# Patient Record
Sex: Male | Born: 1980 | Hispanic: No | Marital: Single | Smoking: Current every day smoker
Health system: Southern US, Community
[De-identification: ages and names within clinical notes are randomized; demographics above are authoritative.]

---
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
## 2002-06-19 ENCOUNTER — Emergency Department (HOSPITAL_COMMUNITY): Admission: EM | Admit: 2002-06-19 | Discharge: 2002-06-19 | Payer: Self-pay | Admitting: Emergency Medicine

## 2002-06-28 ENCOUNTER — Emergency Department (HOSPITAL_COMMUNITY): Admission: EM | Admit: 2002-06-28 | Discharge: 2002-06-28 | Payer: Self-pay | Admitting: Emergency Medicine

## 2002-07-05 ENCOUNTER — Emergency Department (HOSPITAL_COMMUNITY): Admission: EM | Admit: 2002-07-05 | Discharge: 2002-07-05 | Payer: Self-pay | Admitting: Emergency Medicine

## 2002-12-16 ENCOUNTER — Emergency Department (HOSPITAL_COMMUNITY): Admission: EM | Admit: 2002-12-16 | Discharge: 2002-12-16 | Payer: Self-pay | Admitting: Emergency Medicine

## 2003-03-21 ENCOUNTER — Emergency Department (HOSPITAL_COMMUNITY): Admission: EM | Admit: 2003-03-21 | Discharge: 2003-03-21 | Payer: Self-pay | Admitting: Emergency Medicine

## 2005-07-29 ENCOUNTER — Emergency Department (HOSPITAL_COMMUNITY): Admission: EM | Admit: 2005-07-29 | Discharge: 2005-07-29 | Payer: Self-pay | Admitting: Emergency Medicine

## 2005-11-18 ENCOUNTER — Emergency Department (HOSPITAL_COMMUNITY): Admission: EM | Admit: 2005-11-18 | Discharge: 2005-11-18 | Payer: Self-pay | Admitting: Emergency Medicine

## 2005-11-30 ENCOUNTER — Emergency Department (HOSPITAL_COMMUNITY): Admission: EM | Admit: 2005-11-30 | Discharge: 2005-11-30 | Payer: Self-pay | Admitting: Emergency Medicine

## 2010-04-19 ENCOUNTER — Observation Stay (HOSPITAL_COMMUNITY): Admission: EM | Admit: 2010-04-19 | Discharge: 2010-04-20 | Payer: Self-pay | Admitting: Emergency Medicine

## 2011-02-21 LAB — CARDIAC PANEL(CRET KIN+CKTOT+MB+TROPI)
Relative Index: 1.4 (ref 0.0–2.5)
Total CK: 114 U/L (ref 7–232)
Total CK: 153 U/L (ref 7–232)
Troponin I: 0.01 ng/mL (ref 0.00–0.06)
Troponin I: 0.01 ng/mL (ref 0.00–0.06)
Troponin I: 0.01 ng/mL (ref 0.00–0.06)

## 2011-02-21 LAB — RAPID URINE DRUG SCREEN, HOSP PERFORMED
Barbiturates: NOT DETECTED
Benzodiazepines: NOT DETECTED
Cocaine: POSITIVE — AB

## 2011-02-21 LAB — POCT CARDIAC MARKERS
CKMB, poc: 1.2 ng/mL (ref 1.0–8.0)
Myoglobin, poc: 42.9 ng/mL (ref 12–200)
Myoglobin, poc: 47.4 ng/mL (ref 12–200)
Troponin i, poc: <0.05 ng/mL (ref 0.00–0.09)

## 2011-02-21 LAB — POCT I-STAT, CHEM 8
Creatinine, Ser: 0.9 mg/dL (ref 0.4–1.5)
Glucose, Bld: 106 mg/dL — ABNORMAL HIGH (ref 70–99)
Hemoglobin: 17.3 g/dL — ABNORMAL HIGH (ref 13.0–17.0)
Sodium: 138 mEq/L (ref 135–145)
TCO2: 28 mmol/L (ref 0–100)

## 2011-02-21 LAB — LIPID PANEL
Cholesterol: 160 mg/dL (ref 0–200)
HDL: 31 mg/dL — ABNORMAL LOW (ref >39)
Total CHOL/HDL Ratio: 5.2 RATIO

## 2011-02-21 LAB — D-DIMER, QUANTITATIVE: D-Dimer, Quant: 0.26 ug/mL-FEU (ref 0.00–0.48)

## 2011-04-22 NOTE — H&P (Signed)
[facility]  Patient:    [name], [name]                         MRN: [id number] Adm. Date:  [id number] Attending:  Esau Grew Dictator:   Eduard Roux, NP                         History and Physical  ALLERGIES:  No known allergies.  REVIEW OF SYSTEMS:  HEAD:  Patient denies headache, dizziness, head trauma or loss of consciousness.  EYES:  Patient denies visual changes or infections. Patient does not wear corrective lenses.  EARS:  Denies hearing loss, infections, earaches or tinnitus.  NOSE:  Denies upper respiratory tract symptoms and a history of epistaxis or loss of smell.  THROAT:  Denies hoarseness, pain or lesions.  DENTAL:  Denies gum infections or toothaches. SKIN:  Denies abnormal bruises, rashes or swelling.  CARDIOVASCULAR:  Denies headaches, previous myocardial infarction, palpitations, hypertension, syncope or edema.  Patient does have a murmur.  PULMONARY:  Patient has a history of bronchitis.  No dyspnea at rest or with exertion noted.  No bronchitis or hemoptysis.  GASTROINTESTINAL:  No abdominal pain, peptic ulcer disease, melena, dysphagia, diarrhea, nausea and vomiting. GENITOURINARY:  Denies dysuria, urgency or frequency.  He was treated for gonorrhea approximately one month ago.  MUSCULOSKELETAL:  Denies back pain, joint pain, weakness or paresthesias.  NERVOUS SYSTEM:  Denies seizures or blackouts.  HEMATOPOIETIC: Denies anemia or having received a blood transfusion.  ENDOCRINE:  Denies fever or cold intolerance, palpitations, changes in skin, hair or bowel habits.  No polyuria, polydipsia or polyphagia.  Patient is an 30 year old Native American single male.  He is involuntarily admitted for depression with suicidal ideation.  Additionally, he has been abusing cocaine.  PHYSICAL EXAMINATION:  VITAL SIGNS:  Temperature 97.8, pulse 79, respirations 20, blood pressure 137/69.  GENERAL APPEARANCE:  He is a  well-nourished [place] male.  He is alert and cooperative, in no acute distress.  SKIN:  No abnormal bruises, rashes or swelling could be appreciated.  HEENT:  Head normocephalic.  Eyes:  Extraocular movements intact.  Pupils were equal, round and reactive to light.  Funduscopic exam benign.  Ears:  Canals were clear, tympanic membranes exhibiting normal landmarks.  Nose:  Nares were clear.  No septal deviation.  Throat:  Buccal mucosa was ______. There were no lesions.  Tongue was in midline without vesiculations.  Dentition: Good repair.  NECK:  Supple with trachea midline.  Thorax:  Bilateral symmetrical expansion.  LUNGS:  Clear to auscultation.  HEART:  Grade 1-1/2 murmur with intermittent systolic click audible at fourth intercostal space with slightly irregular rhythm.  VASCULAR:  Pulses were equal.  No bruits could be auscultated at aortic, renal or carotid.  There was no dependent edema.  ABDOMEN:  Soft without masses.  Bowel sounds x 4 and were normal.  No tenderness with palpation.  No organomegaly could be appreciated.  GLANDULAR:  Negative adenopathy, supraclavicular cervical.  Palpable thyroid without nodularity.  BONES AND JOINTS:  No swelling, tenderness or deformity noted.  MUSCULAR:  No atrophy noted.  Strength was equal.  EXTREMITIES:  Full range of motion.  No cyanosis or clubbing.  NEUROLOGICAL EXAM:  Cranial nerves II-XII were grossly intact.  Cerebellar function was intact.  Patient could perform finger to nose, heel to shin. Gait was normal.  Negative Romberg.  Patient could  walk in tandem on his heels, on his toes.  Deep tendon reflexes were 2.  IMPRESSION:  Systolic click, grade 1-1/2 murmur.  Abnormal labs were urinalysis.  Will repeat urinalysis.  LABORATORY VALUES:  Urinalysis with small amount of leukocyte esterase, wbcs 6-10.  CMET was within normal limits.  Urine drug screen was negative.  CBC increased platelets of 443.  Blood alcohol level  was less than 10. DD:  05/03/00 TD:  05/03/00 Job: [id number]
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
## 2011-08-05 ENCOUNTER — Emergency Department (HOSPITAL_COMMUNITY): Payer: Self-pay

## 2011-08-05 ENCOUNTER — Emergency Department (HOSPITAL_COMMUNITY)
Admission: EM | Admit: 2011-08-05 | Discharge: 2011-08-05 | Disposition: A | Discharge status: Home / Self Care | Payer: Self-pay | Attending: Emergency Medicine | Admitting: Emergency Medicine

## 2011-08-05 DIAGNOSIS — M25519 Pain in unspecified shoulder: Secondary | ICD-10-CM | POA: Insufficient documentation

## 2011-08-05 DIAGNOSIS — IMO0002 Reserved for concepts with insufficient information to code with codable children: Secondary | ICD-10-CM | POA: Insufficient documentation

## 2011-08-05 DIAGNOSIS — M25619 Stiffness of unspecified shoulder, not elsewhere classified: Secondary | ICD-10-CM | POA: Insufficient documentation

## 2011-08-05 DIAGNOSIS — R296 Repeated falls: Secondary | ICD-10-CM | POA: Insufficient documentation

## 2011-08-05 DIAGNOSIS — M25429 Effusion, unspecified elbow: Secondary | ICD-10-CM | POA: Insufficient documentation

## 2011-08-05 DIAGNOSIS — Y9364 Activity, baseball: Secondary | ICD-10-CM | POA: Insufficient documentation

## 2012-01-25 ENCOUNTER — Encounter (HOSPITAL_COMMUNITY): Payer: Self-pay | Admitting: Emergency Medicine

## 2012-01-25 ENCOUNTER — Emergency Department (HOSPITAL_COMMUNITY)
Admission: EM | Admit: 2012-01-25 | Discharge: 2012-01-26 | Disposition: A | Discharge status: Home / Self Care | Payer: Self-pay | Attending: Emergency Medicine | Admitting: Emergency Medicine

## 2012-01-25 DIAGNOSIS — J45909 Unspecified asthma, uncomplicated: Secondary | ICD-10-CM | POA: Insufficient documentation

## 2012-01-25 DIAGNOSIS — F17200 Nicotine dependence, unspecified, uncomplicated: Secondary | ICD-10-CM | POA: Insufficient documentation

## 2012-01-25 DIAGNOSIS — K047 Periapical abscess without sinus: Secondary | ICD-10-CM | POA: Insufficient documentation

## 2012-01-25 MED ORDER — PENICILLIN V POTASSIUM 500 MG PO TABS
500.0000 mg | ORAL_TABLET | Freq: Once | ORAL | Status: AC
  Administered 2012-01-25: 500 mg via ORAL
  Filled 2012-01-25: qty 1

## 2012-01-25 MED ORDER — OXYCODONE-ACETAMINOPHEN 5-325 MG PO TABS
1.0000 | ORAL_TABLET | ORAL | Status: AC
  Administered 2012-01-25: 1 via ORAL
  Filled 2012-01-25: qty 1

## 2012-01-25 MED ORDER — OXYCODONE-ACETAMINOPHEN 5-325 MG PO TABS: 1.0000 | ORAL_TABLET | ORAL | Status: AC | PRN

## 2012-01-25 MED ORDER — IBUPROFEN 800 MG PO TABS: 800.0000 mg | ORAL_TABLET | Freq: Four times a day (QID) | ORAL | Status: AC | PRN

## 2012-01-25 MED ORDER — PENICILLIN V POTASSIUM 500 MG PO TABS: 500.0000 mg | ORAL_TABLET | Freq: Four times a day (QID) | ORAL | Status: AC

## 2012-01-25 NOTE — ED Notes (Signed)
Pt alert, nad, c/o dental pain, onset last pm, resp even unlabored, skin pwd

## 2012-01-25 NOTE — Discharge Instructions (Signed)
Call the dentist within forty-eight hours of being seen in the emergency department to set up an appointment for followup.  Please take all of the antibiotics until they are gone.  Use the pain medication as prescribed as needed for pain.  Do not take additional Tylenol while taking the prescribed Percocet as it contains Tylenol; a tylenol overdose can be dangerous to your health or even life threatening.  Please return to the emergency department immediately if you have any difficulty swallowing or breathing develops high fevers while on antibiotics.You may return to the ER at any time for worsening condition or any new symptoms that concern you.   Dental Abscess A dental abscess usually starts from an infected tooth. Antibiotic medicine and pain pills can be helpful, but dental infections require the attention of a dentist. Rinse around the infected area often with salt water (a pinch of salt in 8 oz of warm water). Do not apply heat to the outside of your face. See your dentist or oral surgeon as soon as possible.  SEEK IMMEDIATE MEDICAL CARE IF:  You have increasing, severe pain that is not relieved by medicine.   You or your child has an oral temperature above 102 F (38.9 C), not controlled by medicine.   Your baby is older than 3 months with a rectal temperature of 102 F (38.9 C) or higher.   Your baby is 75 months old or younger with a rectal temperature of 100.4 F (38 C) or higher.   You develop chills, severe headache, difficulty breathing, or trouble swallowing.   You have swelling in the neck or around the eye.  Document Released: 11/21/2005 Document Revised: 08/03/2011 Document Reviewed: 05/02/2007 [facility] Patient Information 2012 [place], [place].

## 2012-01-25 NOTE — ED Provider Notes (Signed)
History     CSN: [id number]  Arrival date & time 01/25/12  2031   First MD Initiated Contact with Patient 01/25/12 2300      Chief Complaint  Patient presents with  . Dental Pain    (Consider location/radiation/quality/duration/timing/severity/associated sxs/prior treatment) HPI Comments: Patient reports he has long-standing dental problems, but cannot afford a dentist at this time.  States he woke up this morning with increased left lower molar pain with some swelling adjacent.  States that throughout the day the pain got worse and the swelling increased.  Reports mild sore throat.  Denies fevers, difficulty swallowing or breathing.  Patient is a 31 y.o. male presenting with tooth pain. The history is provided by the patient.  Dental PainPrimary symptoms do not include shortness of breath.  Additional symptoms do not include: trouble swallowing.    Past Medical History  Diagnosis Date  . Asthma     History reviewed. No pertinent past surgical history.  No family history on file.  History  Substance Use Topics  . Smoking status: Current Everyday Smoker -- 1.0 packs/day    Types: Cigarettes  . Smokeless tobacco: Not on file  . Alcohol Use: No      Review of Systems  HENT: Negative for trouble swallowing.   Respiratory: Negative for shortness of breath and stridor.   All other systems reviewed and are negative.    Allergies  Review of patient's allergies indicates no known allergies.  Home Medications   Current Outpatient Rx  Name Route Sig Dispense Refill  . ACETAMINOPHEN 500 MG PO TABS Oral Take 500 mg by mouth every 6 (six) hours as needed. For pain relief      BP 161/90  Pulse 80  Temp(Src) 98.4 F (36.9 C) (Oral)  Resp 16  Ht 5\' 4"  (1.626 m)  Wt 147 lb 3.2 oz (66.769 kg)  BMI 25.27 kg/m2  SpO2 98%  Physical Exam  Nursing note and vitals reviewed. Constitutional: He is oriented to person, place, and time. He appears well-developed and  well-nourished.  HENT:  Head: Normocephalic and atraumatic.  Mouth/Throat: Uvula is midline, oropharynx is clear and moist and mucous membranes are normal. Dental abscesses present.    Neck: Trachea normal. Neck supple. No tracheal tenderness present. No tracheal deviation present.  Pulmonary/Chest: Effort normal and breath sounds normal. No stridor. No respiratory distress. He has no wheezes. He has no rales. He exhibits no tenderness.  Lymphadenopathy:    He has cervical adenopathy.  Neurological: He is alert and oriented to person, place, and time.  Psychiatric: He has a normal mood and affect. His behavior is normal. Judgment and thought content normal.    ED Course  Procedures (including critical care time)  Labs Reviewed - No data to display No results found.   1. Dental abscess       MDM  Afebrile patient with left lower dental abscess.  No airway compromise, no pharyngeal edema or erythema, no paratracheal tenderness.  Patient given first dose of antibiotic and pain medication here.  Encouraged close follow up with dentist, return for worsening condition.  Patient verbalizes understanding and agrees with plan.          Dillard Cannon [place], [place] 01/26/12 [phone]

## 2012-01-26 NOTE — ED Provider Notes (Signed)
Medical screening examination/treatment/procedure(s) were performed by non-physician practitioner and as supervising physician I was immediately available for consultation/collaboration.   Idrissa Beville, MD 01/26/12 0355 

## 2012-03-24 ENCOUNTER — Emergency Department (HOSPITAL_COMMUNITY)
Admission: EM | Admit: 2012-03-24 | Discharge: 2012-03-24 | Disposition: A | Discharge status: Home / Self Care | Payer: Self-pay | Attending: Emergency Medicine | Admitting: Emergency Medicine

## 2012-03-24 ENCOUNTER — Emergency Department (HOSPITAL_COMMUNITY): Payer: Self-pay

## 2012-03-24 ENCOUNTER — Encounter (HOSPITAL_COMMUNITY): Payer: Self-pay | Admitting: *Deleted

## 2012-03-24 DIAGNOSIS — S43006A Unspecified dislocation of unspecified shoulder joint, initial encounter: Secondary | ICD-10-CM

## 2012-03-24 DIAGNOSIS — S46909A Unspecified injury of unspecified muscle, fascia and tendon at shoulder and upper arm level, unspecified arm, initial encounter: Secondary | ICD-10-CM | POA: Insufficient documentation

## 2012-03-24 DIAGNOSIS — S43016A Anterior dislocation of unspecified humerus, initial encounter: Secondary | ICD-10-CM | POA: Insufficient documentation

## 2012-03-24 DIAGNOSIS — S4980XA Other specified injuries of shoulder and upper arm, unspecified arm, initial encounter: Secondary | ICD-10-CM | POA: Insufficient documentation

## 2012-03-24 DIAGNOSIS — X58XXXA Exposure to other specified factors, initial encounter: Secondary | ICD-10-CM | POA: Insufficient documentation

## 2012-03-24 DIAGNOSIS — M25519 Pain in unspecified shoulder: Secondary | ICD-10-CM | POA: Insufficient documentation

## 2012-03-24 MED ORDER — ETOMIDATE 2 MG/ML IV SOLN
20.0000 mg | Freq: Once | INTRAVENOUS | Status: AC
  Administered 2012-03-24: 20 mg via INTRAVENOUS

## 2012-03-24 MED ORDER — FLUMAZENIL 0.5 MG/5ML IV SOLN
INTRAVENOUS | Status: AC
  Administered 2012-03-24: 0.4 mg via INTRAVENOUS
  Filled 2012-03-24: qty 5

## 2012-03-24 MED ORDER — NALOXONE HCL 0.4 MG/ML IJ SOLN
INTRAMUSCULAR | Status: AC
  Filled 2012-03-24: qty 1

## 2012-03-24 MED ORDER — OXYCODONE-ACETAMINOPHEN 5-325 MG PO TABS: 2.0000 | ORAL_TABLET | ORAL | Status: AC | PRN

## 2012-03-24 MED ORDER — FLUMAZENIL 0.5 MG/5ML IV SOLN
0.4000 mg | Freq: Once | INTRAVENOUS | Status: AC
Start: 2012-03-24 — End: 2012-03-24
  Administered 2012-03-24: 0.4 mg via INTRAVENOUS

## 2012-03-24 MED ORDER — MIDAZOLAM HCL 2 MG/2ML IJ SOLN
3.0000 mg | Freq: Once | INTRAMUSCULAR | Status: AC
  Administered 2012-03-24: 3 mg via INTRAVENOUS

## 2012-03-24 MED ORDER — METHOCARBAMOL 500 MG PO TABS: 500.0000 mg | ORAL_TABLET | Freq: Two times a day (BID) | ORAL | Status: AC

## 2012-03-24 MED ORDER — ETOMIDATE 2 MG/ML IV SOLN
INTRAVENOUS | Status: AC
  Administered 2012-03-24: 20 mg via INTRAVENOUS
  Filled 2012-03-24: qty 10

## 2012-03-24 MED ORDER — FENTANYL CITRATE 0.05 MG/ML IJ SOLN
50.0000 ug | Freq: Once | INTRAMUSCULAR | Status: AC
  Administered 2012-03-24: 50 ug via INTRAVENOUS

## 2012-03-24 MED ORDER — FENTANYL CITRATE 0.05 MG/ML IJ SOLN
INTRAMUSCULAR | Status: AC
  Administered 2012-03-24: 50 ug via INTRAVENOUS
  Filled 2012-03-24: qty 2

## 2012-03-24 MED ORDER — MIDAZOLAM HCL 2 MG/2ML IJ SOLN
INTRAMUSCULAR | Status: AC
  Administered 2012-03-24: 3 mg via INTRAVENOUS
  Filled 2012-03-24: qty 4

## 2012-03-24 MED ORDER — NALOXONE HCL 1 MG/ML IJ SOLN
INTRAMUSCULAR | Status: AC
  Filled 2012-03-24: qty 2

## 2012-03-24 NOTE — ED Notes (Signed)
Shoulder being reduced by Dr Freida Busman at this time

## 2012-03-24 NOTE — ED Notes (Deleted)
Pt c/o pain/deformity to R shoulder x 1 hour after playing sport. Pt received 250 mg Fentanyl by EMS

## 2012-03-24 NOTE — Discharge Instructions (Signed)
Shoulder Dislocation  Your shoulder is made up of three bones: the collar bone (clavicle); the shoulder blade (scapula), which includes the socket (glenoid cavity); and the upper arm bone (humerus). Your shoulder joint is the place where these bones meet. Strong, fibrous tissues hold these bones together (ligaments). Muscles and strong, fibrous tissues that connect the muscles to these bones (tendons) allow your arm to move through this joint. The range of motion of your shoulder joint is more extensive than most of your other joints, and the glenoid cavity is very shallow. That is the reason that your shoulder joint is one of the most unstable joints in your body. It is far more prone to dislocation than your other joints. Shoulder dislocation is when your humerus is forced out of your shoulder joint.  CAUSES  Shoulder dislocation is caused by a forceful impact on your shoulder. This impact usually is from an injury, such as a sports injury or a fall.  SYMPTOMS  Symptoms of shoulder dislocation include:   Deformity of your shoulder.   Intense pain.   Inability to move your shoulder joint.   Numbness, weakness, or tingling around your shoulder joint (your neck or down your arm).   Bruising or swelling around your shoulder.  DIAGNOSIS  In order to diagnose a dislocated shoulder, your caregiver will perform a physical exam. Your caregiver also may have an X-ray exam done to see if you have any broken bones. Magnetic resonance imaging (MRI) is a procedure that sometimes is done to help your caregiver see any damage to the soft tissues around your shoulder, particularly your rotator cuff tendons. Additionally, your caregiver also may have electromyography done to measure the electrical discharges produced in your muscles if you have signs or symptoms of nerve damage.  TREATMENT  A shoulder dislocation is treated by placing the humerus back in the joint (reduction). Your caregiver does this either manually (closed  reduction), by moving your humerus back into the joint through manipulation, or through surgery (open reduction). When your humerus is back in place, severe pain should improve almost immediately.  You also may need to have surgery if you have a weak shoulder joint or ligaments, and you have recurring shoulder dislocations, despite rehabilitation. In rare cases, surgery is necessary if your nerves or blood vessels are damaged during the dislocation.  After your reduction, your arm will be placed in a shoulder immobilizer or sling to keep it from moving. Your caregiver will have you wear your shoulder immoblizer or sling for 3 days to 3 weeks, depending on how serious your dislocation is. When your shoulder immobilizer or sling is removed, your caregiver may prescribe physical therapy to help improve the range of motion in your shoulder joint.  HOME CARE INSTRUCTIONS   The following measures can help to reduce pain and speed up the healing process:   Rest your injured joint. Do not move it. Avoid activities similar to the one that caused your injury.   Apply ice to your injured joint for the first day or two after your reduction or as directed by your caregiver. Applying ice helps to reduce inflammation and pain.   Put ice in a plastic bag.   Place a towel between your skin and the bag.   Leave the ice on for 15 to 20 minutes at a time, every 2 hours while you are awake.   Exercise your hand by squeezing a soft ball. This helps to eliminate stiffness and swelling in your   as told by your caregiver.  SEEK IMMEDIATE MEDICAL CARE IF:   Your shoulder immobilizer or sling becomes damaged.   Your pain becomes worse rather than better.   You lose feeling in your arm or hand, or they become white and cold.  MAKE SURE YOU:   Understand these instructions.   Will watch your condition.   Will get help right  away if you are not doing well or get worse.  Document Released: 08/16/2001 Document Revised: 11/10/2011 Document Reviewed: 09/11/2011 [facility] Patient Information 2012 [place], [place].Sling Use After Injury or Surgery You have been put in a sling today because of an injury or following surgery. If you have a tendon or bone injury it may take up to 6 weeks to heal. Use the sling as directed until your caregiver says it is no longer needed. The sling protects and keeps you from using the injured part. Hanging your arm in a sling will give rest and support to the injured part. This also helps with comfort and healing. Slings are used for injuries made worse or more painful by movement. Examples include:  Broken arms.   Broken collarbones.   Shoulder injuries.   Following surgery.  The sling should fit comfortably, with your elbow at one end of the sling and your hand at the other end. Your elbow is bent 90 degrees lying across your waist and rests in the sling with your thumb pointing up. Your hand is outside the open end of the sling. Your hand should be slightly higher than your elbow. You may also pad the sling behind your neck with some cloth or foam rubber.  A swathe may also be used if it is necessary to keep you from lifting your injured arm. A swathe is a wrap or ace bandage that goes around your chest over your injured arm.  To take the weight off your neck, some slings have a strap that goes around your neck and down your back. One strap is connected to the closed elbow side of the sling with the other end of the strap attached to the wrist side. With a sling like this, your injured shoulder, arm, wrist, or hand is in the sling, the weight is more on your shoulder and back. This is different from the illustration where the sling is supported only by the neck.  In an emergency, a sling can be as simple as a belt or towel tied around your neck to hold your forearm.  HOME CARE INSTRUCTIONS    Do not use your shoulder until instructed to by your caregiver.   If you have been prescribed physical therapy, keep appointments as directed.   For the first couple days following your injury and during times when you are sore, you may use ice on the injured area for 15 to 20 minutes 3 to 4 times per day while awake. Put the ice in a plastic bag and place a towel between the bag of ice and your skin. This will help keep the swelling down.   If there is numbness in the fifth finger and ring fingers you may need to pad the elbow to relieve pressure on the ulnar nerve (the crazy bone).   Keep your arm on your chest when lying down.   If a plaster splint was applied, wear the splint until you are seen for a follow-up examination. Rest it on nothing harder than a pillow the first 24 hours. Do not get it wet. You  may take it off to take a shower or bath unless instructed otherwise by your caregiver.   You may have been given an elastic bandage to use with the plaster splint or alone. The splint is too tight if you have numbness, tingling, or if your hand becomes cold and blue. Adjust or reapply the bandage to make it comfortable.   Only take over-the-counter or prescription medicines for pain, discomfort, or fever as directed by your caregiver.   If range of motion exercises are permitted by your caregiver, do not go over the limits suggested. If you have increased pain from doing gentle exercises, stop the exercises until you see your caregiver again.   The length of time needed for healing depends on what your injury or surgery was.  SEEK IMMEDIATE MEDICAL CARE IF:   You have an increase in bruising, swelling or pain in the area of your injury or surgery.   You notice a blue color of or coldness in your fingers.   Pain relief is not obtained with medications or any of your problems are getting worse.  Document Released: 07/05/2004 Document Revised: 11/10/2011 Document Reviewed:  10/06/2007 [facility] Patient Information 2012 [place], [place].

## 2012-03-24 NOTE — ED Provider Notes (Signed)
History     CSN: [id number]  Arrival date & time 03/24/12  Mikle Bosworth   First MD Initiated Contact with Patient 03/24/12 1945      Chief Complaint  Patient presents with  . Shoulder Injury    (Consider location/radiation/quality/duration/timing/severity/associated sxs/prior treatment) Patient is a 31 y.o. male presenting with shoulder injury. The history is provided by the patient and a relative.  Shoulder Injury   patient here with right shoulder pain after direct trauma. Shoulder has been dislocated before in the past and patient feels like it is again. Denies any numbness to his right hand. Called EMS and was given fentanyl with slight relief of pain. Pain described as severe worse with any movement  Past Medical History  Diagnosis Date  . Asthma     No past surgical history on file.  No family history on file.  History  Substance Use Topics  . Smoking status: Current Everyday Smoker -- 2.0 packs/day    Types: Cigarettes  . Smokeless tobacco: Not on file  . Alcohol Use: Yes      Review of Systems  All other systems reviewed and are negative.    Allergies  Review of patient's allergies indicates no known allergies.  Home Medications  No current outpatient prescriptions on file.  Pulse 72  Temp(Src) 98.6 F (37 C) (Oral)  Resp 20  SpO2 97%  Physical Exam  Nursing note and vitals reviewed. Constitutional: He is oriented to person, place, and time. He appears well-developed and well-nourished.  Non-toxic appearance. No distress.  HENT:  Head: Normocephalic and atraumatic.  Eyes: Conjunctivae, EOM and lids are normal. Pupils are equal, round, and reactive to light.  Neck: Normal range of motion. Neck supple. No tracheal deviation present. No mass present.  Cardiovascular: Normal rate, regular rhythm and normal heart sounds.  Exam reveals no gallop.   No murmur heard. Pulmonary/Chest: Effort normal and breath sounds normal. No stridor. No respiratory distress.  He has no decreased breath sounds. He has no wheezes. He has no rhonchi. He has no rales.  Abdominal: Soft. Normal appearance and bowel sounds are normal. He exhibits no distension. There is no tenderness. There is no rebound and no CVA tenderness.  Musculoskeletal: He exhibits no edema and no tenderness.       Right shoulder: He exhibits decreased range of motion, tenderness and deformity.  Neurological: He is alert and oriented to person, place, and time. He has normal strength. No cranial nerve deficit or sensory deficit. GCS eye subscore is 4. GCS verbal subscore is 5. GCS motor subscore is 6.  Skin: Skin is warm and dry. No abrasion and no rash noted.  Psychiatric: He has a normal mood and affect. His speech is normal and behavior is normal.    ED Course  Procedural sedation Date/Time: 03/24/2012 8:45 PM Performed by: Toy Baker Authorized by: Lorre Nick T Consent: Verbal consent obtained. Written consent obtained. Risks and benefits: risks, benefits and alternatives were discussed Consent given by: spouse Patient identity confirmed: arm band and verbally with patient Time out: Immediately prior to procedure a "time out" was called to verify the correct patient, procedure, equipment, support staff and site/side marked as required. Patient sedated: yes Sedation type: moderate (conscious) sedation Sedatives: etomidate, fentanyl and midazolam Analgesia: fentanyl Sedation start date/time: 03/24/2012 8:15 PM Sedation end date/time: 03/24/2012 8:40 PM Vitals: Vital signs were monitored during sedation. Patient tolerance: Patient tolerated the procedure well with no immediate complications.  ORTHOPEDIC INJURY TREATMENT Date/Time: 03/24/2012  8:46 PM Performed by: Toy Baker Authorized by: Lorre Nick T Consent: Verbal consent obtained. Written consent obtained. Risks and benefits: risks, benefits and alternatives were discussed Consent given by: spouse Required items:  required blood products, implants, devices, and special equipment available Patient identity confirmed: verbally with patient and arm band Time out: Immediately prior to procedure a "time out" was called to verify the correct patient, procedure, equipment, support staff and site/side marked as required. Injury location: shoulder Location details: right shoulder Injury type: dislocation Dislocation type: anterior Hill-Sachs deformity: noChronicity: new Pre-procedure neurovascular assessment: neurovascularly intact Pre-procedure distal perfusion: normal Pre-procedure neurological function: normal Pre-procedure range of motion: reduced Local anesthesia used: no Patient sedated: yes Manipulation performed: yes Reduction method: external rotation Reduction successful: yes X-ray confirmed reduction: yes Immobilization: sling Post-procedure neurovascular assessment: post-procedure neurovascularly intact Post-procedure distal perfusion: normal Post-procedure neurological function: normal Post-procedure range of motion: normal Patient tolerance: Patient tolerated the procedure well with no immediate complications.   (including critical care time)  Labs Reviewed - No data to display No results found.   No diagnosis found.    MDM    Patient's x-rays noted and shoulders reduced. Patient given referral to orthopedic surgeon on call and also a sling for comfort     Toy Baker, MD 03/24/12 2218

## 2012-03-24 NOTE — ED Notes (Signed)
Pt playing sports and injured right shoulder,  Pt received 250 mcg Fentanyl by EMS

## 2012-03-24 NOTE — ED Notes (Signed)
[id number]<AV> Expected date:03/24/12<BR> Expected time: 6:56 PM<BR> Means of arrival:<BR> Comments:<BR> Dislocated  shoulder

## 2012-05-11 ENCOUNTER — Other Ambulatory Visit (HOSPITAL_COMMUNITY): Payer: Self-pay | Admitting: Orthopaedic Surgery

## 2012-05-11 DIAGNOSIS — R52 Pain, unspecified: Secondary | ICD-10-CM

## 2012-05-11 DIAGNOSIS — M25511 Pain in right shoulder: Secondary | ICD-10-CM

## 2012-05-16 ENCOUNTER — Other Ambulatory Visit (HOSPITAL_COMMUNITY): Payer: Self-pay

## 2012-05-17 ENCOUNTER — Other Ambulatory Visit (HOSPITAL_COMMUNITY): Payer: Self-pay

## 2012-05-18 ENCOUNTER — Ambulatory Visit (HOSPITAL_COMMUNITY)
Admission: RE | Admit: 2012-05-18 | Discharge: 2012-05-18 | Disposition: A | Discharge status: Home / Self Care | Payer: Self-pay | Source: Ambulatory Visit | Attending: Orthopaedic Surgery | Admitting: Orthopaedic Surgery

## 2012-05-18 DIAGNOSIS — M25519 Pain in unspecified shoulder: Secondary | ICD-10-CM | POA: Insufficient documentation

## 2012-05-18 DIAGNOSIS — M25511 Pain in right shoulder: Secondary | ICD-10-CM

## 2012-05-18 MED ORDER — IOHEXOL 180 MG/ML  SOLN
15.0000 mL | Freq: Once | INTRAMUSCULAR | Status: AC | PRN
  Administered 2012-05-18: 15 mL via INTRA_ARTICULAR

## 2012-05-18 MED ORDER — GADOBENATE DIMEGLUMINE 529 MG/ML IV SOLN
0.1000 mL | Freq: Once | INTRAVENOUS | Status: AC | PRN
  Administered 2012-05-18: 0.1 mL via INTRAVENOUS

## 2012-05-18 NOTE — Procedures (Signed)
Right shoulder injection for MR arthrography was performed without complication.

## 2013-08-19 ENCOUNTER — Encounter (HOSPITAL_COMMUNITY): Payer: Self-pay | Admitting: *Deleted

## 2013-08-19 ENCOUNTER — Emergency Department (HOSPITAL_COMMUNITY)
Admission: EM | Admit: 2013-08-19 | Discharge: 2013-08-19 | Disposition: A | Discharge status: Home / Self Care | Payer: BC Managed Care – PPO | Attending: Emergency Medicine | Admitting: Emergency Medicine

## 2013-08-19 DIAGNOSIS — M25569 Pain in unspecified knee: Secondary | ICD-10-CM | POA: Insufficient documentation

## 2013-08-19 DIAGNOSIS — J45909 Unspecified asthma, uncomplicated: Secondary | ICD-10-CM | POA: Insufficient documentation

## 2013-08-19 DIAGNOSIS — M25469 Effusion, unspecified knee: Secondary | ICD-10-CM | POA: Insufficient documentation

## 2013-08-19 DIAGNOSIS — F17200 Nicotine dependence, unspecified, uncomplicated: Secondary | ICD-10-CM | POA: Insufficient documentation

## 2013-08-19 DIAGNOSIS — M2550 Pain in unspecified joint: Secondary | ICD-10-CM

## 2013-08-19 LAB — COMPREHENSIVE METABOLIC PANEL
Alkaline Phosphatase: 80 U/L (ref 39–117)
BUN: 9 mg/dL (ref 6–23)
GFR calc Af Amer: >90 mL/min (ref >90)
Glucose, Bld: 95 mg/dL (ref 70–99)
Potassium: 3.9 mEq/L (ref 3.5–5.1)
Total Bilirubin: 0.6 mg/dL (ref 0.3–1.2)
Total Protein: 7.4 g/dL (ref 6.0–8.3)

## 2013-08-19 LAB — CBC WITH DIFFERENTIAL/PLATELET
Eosinophils Absolute: 0.3 10*3/uL (ref 0.0–0.7)
HCT: 43.5 % (ref 39.0–52.0)
Hemoglobin: 15.7 g/dL (ref 13.0–17.0)
Lymphs Abs: 2.9 10*3/uL (ref 0.7–4.0)
MCH: 31.3 pg (ref 26.0–34.0)
MCV: 86.7 fL (ref 78.0–100.0)
Monocytes Relative: 11 % (ref 3–12)
Neutrophils Relative %: 67 % (ref 43–77)
RBC: 5.02 MIL/uL (ref 4.22–5.81)

## 2013-08-19 LAB — URINALYSIS, ROUTINE W REFLEX MICROSCOPIC
Ketones, ur: NEGATIVE mg/dL
Leukocytes, UA: NEGATIVE
Nitrite: NEGATIVE
Protein, ur: NEGATIVE mg/dL
Urobilinogen, UA: 0.2 mg/dL (ref 0.0–1.0)

## 2013-08-19 MED ORDER — HYDROCODONE-ACETAMINOPHEN 5-325 MG PO TABS
1.0000 | ORAL_TABLET | Freq: Once | ORAL | Status: AC
  Administered 2013-08-19: 1 via ORAL
  Filled 2013-08-19: qty 1

## 2013-08-19 MED ORDER — DOXYCYCLINE HYCLATE 100 MG PO CAPS: 100.0000 mg | ORAL_CAPSULE | Freq: Two times a day (BID) | ORAL | Status: DC

## 2013-08-19 MED ORDER — HYDROCODONE-ACETAMINOPHEN 5-325 MG PO TABS: ORAL_TABLET | ORAL | Status: DC

## 2013-08-19 MED ORDER — NAPROXEN 500 MG PO TABS: 500.0000 mg | ORAL_TABLET | Freq: Two times a day (BID) | ORAL | Status: DC

## 2013-08-19 MED ORDER — DOXYCYCLINE HYCLATE 100 MG PO TABS
100.0000 mg | ORAL_TABLET | Freq: Once | ORAL | Status: AC
  Administered 2013-08-19: 100 mg via ORAL
  Filled 2013-08-19: qty 1

## 2013-08-19 MED ORDER — KETOROLAC TROMETHAMINE 60 MG/2ML IM SOLN
60.0000 mg | Freq: Once | INTRAMUSCULAR | Status: AC
  Administered 2013-08-19: 60 mg via INTRAMUSCULAR
  Filled 2013-08-19: qty 2

## 2013-08-19 NOTE — ED Provider Notes (Signed)
CSN: [id number]     Arrival date & time 08/19/13  1027 History  This chart was scribed for Rhea Bleacher, PA, working with Celene Kras, MD by Blanchard Kelch, ED Scribe. This patient was seen in room TR08C/TR08C and the patient's care was started at 11:07 AM.    Chief Complaint  Patient presents with  . Knee Pain    Patient is a 32 y.o. male presenting with knee pain. The history is provided by the patient. No language interpreter was used.  Knee Pain Associated symptoms: no back pain, no fever and no neck pain     HPI Comments: [name] is a 31 y.o. male who presents to the Emergency Department complaining of constant, worsening right knee pain with associated swelling that began yesterday upon waking. He also complains of bilateral ankle pain and left knee pain that occurred later in the day while watching a sporting event. He has been taking Advil for the pain with mild relief of pain and swelling. He denies fever, chill, nausea, vomiting, bowel or urinary incontinence. He is sexually active with his wife. He denies any knowledge of recent tick bites -- however recently had a red swollen area on anterior L thigh. He is a Administrator. He is not allergic to any medications. No h/o autoimmune disease. No eye complaints.   Pt denies having a PCP currently.   Past Medical History  Diagnosis Date  . Asthma    History reviewed. No pertinent past surgical history. No family history on file. History  Substance Use Topics  . Smoking status: Current Every Day Smoker -- 2.00 packs/day    Types: Cigarettes  . Smokeless tobacco: Not on file  . Alcohol Use: Yes     Comment: w/e    Review of Systems  Constitutional: Negative for fever, chills and activity change.  HENT: Negative for neck pain.   Gastrointestinal: Negative for nausea and vomiting.       Negative for bowel incontinence.  Genitourinary:       Negative to urinary incontinence.   Musculoskeletal: Positive for arthralgias.  Negative for back pain, joint swelling and gait problem.       Positive for bilateral knee and ankle pain.  Skin: Negative for wound.  Neurological: Negative for weakness and numbness.    Allergies  Review of patient's allergies indicates no known allergies.  Home Medications   Current Outpatient Rx  Name  Route  Sig  Dispense  Refill  . doxycycline (VIBRAMYCIN) 100 MG capsule   Oral   Take 1 capsule (100 mg total) by mouth 2 (two) times daily.   14 capsule   0   . HYDROcodone-acetaminophen (NORCO/VICODIN) 5-325 MG per tablet      Take 1-2 tablets every 6 hours as needed for severe pain   12 tablet   0   . naproxen (NAPROSYN) 500 MG tablet   Oral   Take 1 tablet (500 mg total) by mouth 2 (two) times daily.   20 tablet   0     Triage Vitals: BP 146/63  Pulse 84  Temp(Src) 98.2 F (36.8 C) (Oral)  Resp 20  Ht 5\' 4"  (1.626 m)  Wt 155 lb (70.308 kg)  BMI 26.59 kg/m2  SpO2 100%  Physical Exam  Nursing note and vitals reviewed. Constitutional: He appears well-developed and well-nourished.  HENT:  Head: Normocephalic and atraumatic.  Eyes: Conjunctivae are normal.  Neck: Normal range of motion. Neck supple.  Cardiovascular: Normal pulses.  Musculoskeletal: He exhibits tenderness. He exhibits no edema.       Right shoulder: Normal.       Left shoulder: Normal.       Right hip: Normal.       Left hip: Normal.       Right knee: He exhibits normal range of motion, no swelling, no effusion and no erythema. Tenderness found.       Left knee: He exhibits no effusion, no deformity and no erythema. Tenderness found.       Right ankle: He exhibits normal range of motion and no swelling. Tenderness.       Left ankle: He exhibits normal range of motion and no swelling. Tenderness.  Neurological: He is alert. No sensory deficit.  Motor, sensation, and vascular distal to the injury is fully intact.   Skin: Skin is warm and dry.  Psychiatric: He has a normal mood and affect.     ED Course  Procedures (including critical care time)  DIAGNOSTIC STUDIES: Oxygen Saturation is 100% on room air, normal by my interpretation.    COORDINATION OF CARE:  11:07 AM - Patient verbalizes understanding and agrees with treatment plan.  Labs Review Labs Reviewed  CBC WITH DIFFERENTIAL - Abnormal; Notable for the following:    WBC 15.2 (*)    MCHC 36.1 (*)    Platelets 416 (*)    Neutro Abs 10.2 (*)    Monocytes Absolute 1.7 (*)    All other components within normal limits  COMPREHENSIVE METABOLIC PANEL - Abnormal; Notable for the following:    Sodium 132 (*)    All other components within normal limits  URINALYSIS, ROUTINE W REFLEX MICROSCOPIC - Abnormal; Notable for the following:    Hgb urine dipstick SMALL (*)    All other components within normal limits  SEDIMENTATION RATE  URINE MICROSCOPIC-ADD ON   Imaging Review No results found.  12:18 PM Patient seen and examined. Work-up initiated. D/w Dr. Lynelle Doctor.   Vital signs reviewed and are as follows: Filed Vitals:   08/19/13 1040  BP: 146/63  Pulse: 84  Temp: 98.2 F (36.8 C)  Resp: 20   Patient informed of lab results. Will treat empirically with doxycycline. Patient has seen Dr. Magnus Ivan in the past and I have urged him to followup for further evaluation. He is to return to the emergency department with fever, vomiting, inability to walk, worsening symptoms. Patient verbalizes understanding and agrees with plan.   MDM   1. Arthralgia    Patient with polyarthralgias. Otherwise, no systemic symptoms of illness including fever. Suspect inflammatory arthritis. Less likely, septic arthritis. Patient is able to move all extremities and walk. Pain control and orthopedic followup indicated. 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 Cannot rule out tickborne illness. Otherwise patient appears well, nontoxic. No indication for emergent  hospitalization or orthopedic consultation.  I personally performed the services described in this documentation, which was scribed in my presence. The recorded information has been reviewed and is accurate.    Renne Crigler, PA-C 08/19/13 1610

## 2013-08-19 NOTE — ED Notes (Signed)
Pt woke up yesterday with right knee pain and had pain with weight bearing.  Pt is now having right and left knee and ankle pain.

## 2013-08-21 NOTE — ED Provider Notes (Signed)
Medical screening examination/treatment/procedure(s) were performed by non-physician practitioner and as supervising physician I was immediately available for consultation/collaboration.    Kanton Kamel R Ryosuke Ericksen, MD 08/21/13 1635 

## 2013-08-22 ENCOUNTER — Telehealth: Payer: Self-pay | Admitting: Family Medicine

## 2013-08-22 ENCOUNTER — Encounter (HOSPITAL_COMMUNITY): Payer: Self-pay | Admitting: Emergency Medicine

## 2013-08-22 ENCOUNTER — Emergency Department (HOSPITAL_COMMUNITY)
Admission: EM | Admit: 2013-08-22 | Discharge: 2013-08-22 | Disposition: A | Discharge status: Home / Self Care | Payer: BC Managed Care – PPO | Attending: Emergency Medicine | Admitting: Emergency Medicine

## 2013-08-22 DIAGNOSIS — Z791 Long term (current) use of non-steroidal anti-inflammatories (NSAID): Secondary | ICD-10-CM | POA: Insufficient documentation

## 2013-08-22 DIAGNOSIS — J029 Acute pharyngitis, unspecified: Secondary | ICD-10-CM | POA: Insufficient documentation

## 2013-08-22 DIAGNOSIS — R21 Rash and other nonspecific skin eruption: Secondary | ICD-10-CM | POA: Insufficient documentation

## 2013-08-22 DIAGNOSIS — I010 Acute rheumatic pericarditis: Secondary | ICD-10-CM | POA: Insufficient documentation

## 2013-08-22 DIAGNOSIS — J45909 Unspecified asthma, uncomplicated: Secondary | ICD-10-CM | POA: Insufficient documentation

## 2013-08-22 DIAGNOSIS — F17200 Nicotine dependence, unspecified, uncomplicated: Secondary | ICD-10-CM | POA: Insufficient documentation

## 2013-08-22 DIAGNOSIS — Z79899 Other long term (current) drug therapy: Secondary | ICD-10-CM | POA: Insufficient documentation

## 2013-08-22 LAB — COMPREHENSIVE METABOLIC PANEL
AST: 23 U/L (ref 0–37)
Albumin: 3.9 g/dL (ref 3.5–5.2)
CO2: 27 mEq/L (ref 19–32)
Calcium: 10 mg/dL (ref 8.4–10.5)
Creatinine, Ser: 0.75 mg/dL (ref 0.50–1.35)
GFR calc non Af Amer: >90 mL/min (ref >90)

## 2013-08-22 LAB — URINALYSIS, ROUTINE W REFLEX MICROSCOPIC
Ketones, ur: NEGATIVE mg/dL
Protein, ur: NEGATIVE mg/dL
Urobilinogen, UA: 0.2 mg/dL (ref 0.0–1.0)

## 2013-08-22 LAB — SEDIMENTATION RATE: Sed Rate: 31 mm/hr — ABNORMAL HIGH (ref 0–16)

## 2013-08-22 LAB — URINE MICROSCOPIC-ADD ON

## 2013-08-22 LAB — CBC WITH DIFFERENTIAL/PLATELET
Basophils Absolute: 0.1 10*3/uL (ref 0.0–0.1)
Basophils Relative: 0 % (ref 0–1)
Eosinophils Relative: 4 % (ref 0–5)
HCT: 45.4 % (ref 39.0–52.0)
MCHC: 34.1 g/dL (ref 30.0–36.0)
MCV: 87.6 fL (ref 78.0–100.0)
Monocytes Absolute: 1.2 10*3/uL — ABNORMAL HIGH (ref 0.1–1.0)
RDW: 12.9 % (ref 11.5–15.5)

## 2013-08-22 MED ORDER — SODIUM CHLORIDE 0.9 % IV BOLUS (SEPSIS)
1000.0000 mL | Freq: Once | INTRAVENOUS | Status: AC
  Administered 2013-08-22: 1000 mL via INTRAVENOUS

## 2013-08-22 MED ORDER — HYDROCODONE-ACETAMINOPHEN 5-325 MG PO TABS: 1.0000 | ORAL_TABLET | ORAL | Status: DC | PRN

## 2013-08-22 MED ORDER — PENICILLIN G BENZATHINE & PROC 1200000 UNIT/2ML IM SUSP
1.2000 10*6.[IU] | Freq: Once | INTRAMUSCULAR | Status: AC
  Administered 2013-08-22: 1.2 10*6.[IU] via INTRAMUSCULAR
  Filled 2013-08-22: qty 2

## 2013-08-22 MED ORDER — PENICILLIN V POTASSIUM 500 MG PO TABS: 500.0000 mg | ORAL_TABLET | Freq: Four times a day (QID) | ORAL | Status: DC

## 2013-08-22 MED ORDER — ASPIRIN EC 325 MG PO TBEC: 1300.0000 mg | DELAYED_RELEASE_TABLET | Freq: Four times a day (QID) | ORAL | Status: DC

## 2013-08-22 MED ORDER — ASPIRIN EC 325 MG PO TBEC
1000.0000 mg | DELAYED_RELEASE_TABLET | ORAL | Status: DC
  Administered 2013-08-22 (×2): 975 mg via ORAL
  Filled 2013-08-22 (×5): qty 3

## 2013-08-22 MED ORDER — PENICILLIN V POTASSIUM 500 MG PO TABS
500.0000 mg | ORAL_TABLET | Freq: Once | ORAL | Status: AC
  Administered 2013-08-22: 500 mg via ORAL
  Filled 2013-08-22: qty 1

## 2013-08-22 NOTE — ED Provider Notes (Signed)
CSN: [id number]     Arrival date & time 08/22/13  1436 History  This chart was scribed for non-physician practitioner Renne Crigler, PA-C working with Enid Skeens, MD by Danella Maiers, ED Scribe. This patient was seen in room WTR5/WTR5 and the patient's care was started at 2:46 PM.    Chief Complaint  Patient presents with  . Rash    multiple raised areas under skin on arms and legs  . Ankle Pain    r/ankle pain  . Generalized Body Aches  . Fever    intermittent fever x 3 days  . Chills   The history is provided by the patient. No language interpreter was used.   HPI Comments: [name]. is a 32 y.o. male who presents to the Emergency Department complaining fever, sore throat, continued but improving joint pain in bilateral knees and ankles, HA and of bumps to the left shin and bilateral forearms/wrist that are tender. He states pain worsens when he goes to the bathroom.  He claims he can "feel the blood pumping to the bumps". Patient was seen by myself 3 days ago with arthralgias only. He had WBC 15k but negative work-up otherwise, including normal ESR. Treated with doxycycline, naproxen, and Vicodin which he has been taking. He also reports a swollen throat on the left side. He states the knee pain has improved but the ankle pain has not. No other treatments. The onset of this condition was acute. The course is constant. Patient is uncertain of previous sore throats.    Past Medical History  Diagnosis Date  . Asthma    No past surgical history on file. No family history on file. History  Substance Use Topics  . Smoking status: Current Every Day Smoker -- 2.00 packs/day    Types: Cigarettes  . Smokeless tobacco: Not on file  . Alcohol Use: Yes     Comment: w/e    Review of Systems  Constitutional: Positive for fever and chills.  HENT: Positive for sore throat. Negative for congestion and rhinorrhea.   Eyes: Negative for redness.  Respiratory: Negative for cough.    Cardiovascular: Negative for chest pain.  Gastrointestinal: Negative for nausea, vomiting, abdominal pain and diarrhea.  Genitourinary: Negative for dysuria and discharge.  Musculoskeletal: Positive for joint swelling and arthralgias (ankle pain). Negative for myalgias.  Skin: Positive for rash (nodules).  Neurological: Positive for headaches.    Allergies  Review of patient's allergies indicates no known allergies.  Home Medications   Current Outpatient Rx  Name  Route  Sig  Dispense  Refill  . doxycycline (VIBRAMYCIN) 100 MG capsule   Oral   Take 1 capsule (100 mg total) by mouth 2 (two) times daily.   14 capsule   0   . HYDROcodone-acetaminophen (NORCO/VICODIN) 5-325 MG per tablet      Take 1-2 tablets every 6 hours as needed for severe pain   12 tablet   0   . naproxen (NAPROSYN) 500 MG tablet   Oral   Take 1 tablet (500 mg total) by mouth 2 (two) times daily.   20 tablet   0    BP 131/86  Pulse 90  Temp(Src) 98.2 F (36.8 C) (Oral)  Resp 18  SpO2 98% Physical Exam  Nursing note and vitals reviewed. Constitutional: He appears well-developed and well-nourished.  HENT:  Head: Normocephalic and atraumatic.  Right Ear: External ear normal.  Left Ear: External ear normal.  Mouth/Throat: Posterior oropharyngeal erythema present. No posterior  oropharyngeal edema or tonsillar abscesses.  Eyes: Conjunctivae are normal. Right eye exhibits no discharge. Left eye exhibits no discharge.  Neck: Normal range of motion. Neck supple.  Cardiovascular: Normal rate, regular rhythm and normal heart sounds.   No murmur heard. Pulmonary/Chest: Effort normal and breath sounds normal. No respiratory distress. He has no wheezes.  Abdominal: Soft. There is no tenderness.  Musculoskeletal: He exhibits tenderness. He exhibits no edema.       Right hip: Normal.       Left hip: Normal.       Right knee: He exhibits effusion (mild). He exhibits normal range of motion. Tenderness found.        Left knee: He exhibits effusion (mild). He exhibits normal range of motion. Tenderness found.       Right ankle: He exhibits normal range of motion and no swelling. Tenderness.       Left ankle: He exhibits normal range of motion and no swelling. Tenderness.       Right foot: Normal.       Left foot: Normal.  Antalgic gait. Warmth of R knee improved from 3 days ago.   Lymphadenopathy:    He has no cervical adenopathy.  Neurological: He is alert.  No chorea.   Skin: Skin is warm and dry. No rash noted. No erythema.  Bilateral tender subcutaneous nodules of wrists. Pt reports feeling same in shin area but I cannot palpate these.   Psychiatric: He has a normal mood and affect.    ED Course  Procedures (including critical care time) Medications - No data to display  DIAGNOSTIC STUDIES: Oxygen Saturation is 98% on room air, normal by my interpretation.    COORDINATION OF CARE: 3:09 PM- Discussed treatment plan with pt and pt agrees to plan.    Labs Review Labs Reviewed  CBC WITH DIFFERENTIAL - Abnormal; Notable for the following:    WBC 14.1 (*)    Platelets 449 (*)    Neutro Abs 9.7 (*)    Monocytes Absolute 1.2 (*)    All other components within normal limits  SEDIMENTATION RATE - Abnormal; Notable for the following:    Sed Rate 31 (*)    All other components within normal limits  URINALYSIS, ROUTINE W REFLEX MICROSCOPIC - Abnormal; Notable for the following:    Hgb urine dipstick SMALL (*)    All other components within normal limits  URINE MICROSCOPIC-ADD ON - Abnormal; Notable for the following:    Squamous Epithelial / LPF FEW (*)    All other components within normal limits  RAPID STREP SCREEN  CULTURE, GROUP A STREP  GC/CHLAMYDIA PROBE AMP  COMPREHENSIVE METABOLIC PANEL  TROPONIN I  ANTISTREPTOLYSIN O TITER  ANA  RHEUMATOID FACTOR  ROCKY MTN SPOTTED FVR AB, IGM-BLOOD  ROCKY MTN SPOTTED FVR AB, IGG-BLOOD  B. BURGDORFI ANTIBODIES   Imaging Review No  results found.  3:47 PM Patient seen and examined. Discussed with and seen by Dr. Fayrene Fearing, who will assume care. Pt has 2 major symptoms of rheumatic fever. Work-up initiated. Medications ordered.   Vital signs reviewed and are as follows: Filed Vitals:   08/22/13 1500  BP: 131/86  Pulse: 90  Temp: 98.2 F (36.8 C)  Resp: 18   4:10 PM EKG suggestive of pericarditis.    Date: 08/22/2013  Rate: 80  Rhythm: normal sinus rhythm  QRS Axis: normal  Intervals: normal  ST/T Wave abnormalities: ST elevations diffusely  Conduction Disutrbances:none  Narrative Interpretation:  Old EKG Reviewed: none available       MDM   1. Active rheumatic fever with pericarditis    Pending work-up.    I personally performed the services described in this documentation, which was scribed in my presence. The recorded information has been reviewed and is accurate.    Renne Crigler, PA-C 08/22/13 2026

## 2013-08-22 NOTE — ED Notes (Signed)
Bed: ZO10 Expected date:  Expected time:  Means of arrival:  Comments: Rm 5

## 2013-08-22 NOTE — Telephone Encounter (Signed)
Received call from Dr. Fayrene Fearing at [facility] calling regarding patient. Patient doesn't have PCP and is seen in the ED with signs and symptoms suggestive of rheumatic fever. Dr. Fayrene Fearing called the [facility] Medicine Teaching Service to see if we would consider accepting him as a new patient, especially given his educational clinical presentation and diagnosis.  Given that it was after hours and clinic was closed for me to check on current new patient policy, I recommended that patient call the clinic tomorrow to make appointment for early next week with one of the residents on the inpatient team. Will forward message to Dennison Nancy and the admin team.   Marena Chancy, PGY-3 Family Medicine Resident

## 2013-08-22 NOTE — ED Notes (Signed)
Pt reports recurrent fever, generalized joint pain, r/ankle pain, throat pain x 4 days

## 2013-08-22 NOTE — ED Provider Notes (Addendum)
Has seen and evaluated this patient with  PA.  My working diagnosis is that of acute rheumatic fever. He is positive Jones criteria with fever, saphenous nodules, signs of carditis on EKG. His white blood cell count, platelet count, and sedimentation rate are elevated. At this point the only confirming tested due to have is that of proof of recent strep infection. His ASO titer is pending and will not be resulted until tomorrow. Distal testing obtained and pending are Rickettsia, and RMSF antibody titer.  RA, and ANA also pending at this time.    His arthritis is clearing and improving after the anti-inflammatory for the last 4 days. The plan at this time is given Bicillin, high does aspirin, pain control.  I am attempting to arrange some followup outpatient care for him. He will at some point need echocardiogram. He does not need that tonight.  He has no complaint of chest pain. He has not had a rub on exam. He has J-point elevation versus subtle ST elevations diffusely. He does not require hospitalization.  I placed a call to the family medicine resident clinic resident on call. He may range the patient be followed up [phone] family medicine clinic. I discussed at length with him.  EKG: Indication acute manic fever. Interpretation less than 1 mm elevation precordial leads and lateral leads. Is not tachycardic. He has no P. or depression. This may represent a subtle pericarditis or early benign repolarization.  Roney Marion, MD 08/22/13 2031  Roney Marion, MD 08/22/13 2050  Roney Marion, MD 08/22/13 [phone]

## 2013-08-23 LAB — ANTISTREPTOLYSIN O TITER: ASO: 223 IU/mL (ref <409)

## 2013-08-23 LAB — ROCKY MTN SPOTTED FVR AB, IGM-BLOOD: RMSF IgM: 1.44 IV (ref 0.00–0.89)

## 2013-08-23 LAB — ANA: Anti Nuclear Antibody(ANA): NEGATIVE

## 2013-08-23 LAB — GC/CHLAMYDIA PROBE AMP
CT Probe RNA: NEGATIVE
GC Probe RNA: NEGATIVE

## 2013-08-23 NOTE — ED Notes (Signed)
Called patient in regards to lab result

## 2013-08-23 NOTE — Progress Notes (Signed)
[facility] consulted by ED RN for medication assistance. As per ED charge nurse, patient's wife upset stating that the hospital should pay for her husband's prescription. Patient presented to the ED on 09/15 and was diagnosed with [facility] Spotted Fever and prescribed doxycycline.  Patient returned on 09/18 and was diagnosed with Rheumatic Fever and was treated with PenV and aspirin.  Final results for [facility] Spotted Fever confirmed on 09/19 by EDP Plunkett who spoke to patient and advised to go back on doxycycline and stop penicilin and aspirin. As per charge RN, CM informed patient threw doxycycline away.   [facility] called patient at phone number [phone].  Patient stated, "I am just frustrated with the whole thing.  I understand people aren't perfect and it's hard to pinpoint what's wrong with someone."  Patient explained that he did throw his doxycycline prescription away, "When they told me something else was wrong with me."  Patient reports he received a phone call today stating that he needs to be back on doxycycline.  EDCM explained to patient that he will need another prescription.  Patient verbalized understanding.  Patient confirmed his pharmacy is the [facility] on [address].  [facility] consulted with Dr. Fonnie Jarvis and confirmed prescription should be Doxycycline 100mg  po BID for 14 days/28 capsules and permitted [facility]  to call this prescription into patient's pharmacy.  [facility] called prescription into pharmacy on [address] phone number [phone].  EDCM called patient back at phone number [phone] and left voice mail stating new prescription has been called in and cost will be 12 dollars.  EDCMs called [facility] and [facility] to attempt to seek a lower cost of the medication but unsuccessful.  [facility] [place] patient's prescription cost will be 12 dollars.  Patient is not a candidtae for [facility] MATCH program as he has [facility].  This has been confirmed by  patient.  No further interventions at this time.

## 2013-08-23 NOTE — Telephone Encounter (Signed)
Judeth Cornfield - we will be happy to establish this patient.  I will ask the admin team to call him today.  Annice Pih - can you reach out to patient to set up a NP appointment early next week?  Michail Jewels will be his PCP.  If she does not have anything available - fell free to assign to someone else who does have availability.

## 2013-08-23 NOTE — ED Provider Notes (Signed)
Pt's RMFS came back positive and was called and told to start back on doxycycline, and to stop ASA and PCN.  Also pt will hold ASA and see family practice on Monday.  Gwyneth Sprout, MD 08/23/13 1511

## 2013-08-24 LAB — CULTURE, GROUP A STREP

## 2013-08-24 NOTE — ED Notes (Signed)
[facility] formed faxed.

## 2013-08-27 ENCOUNTER — Encounter: Payer: Self-pay | Admitting: Family Medicine

## 2013-08-27 ENCOUNTER — Ambulatory Visit (INDEPENDENT_AMBULATORY_CARE_PROVIDER_SITE_OTHER): Payer: BC Managed Care – PPO | Admitting: Family Medicine

## 2013-08-27 VITALS — BP 122/67 | HR 76 | Temp 97.7°F | Resp 16 | Ht 65.0 in | Wt 156.0 lb

## 2013-08-27 DIAGNOSIS — A779 Spotted fever, unspecified: Secondary | ICD-10-CM

## 2013-08-27 DIAGNOSIS — A770 Spotted fever due to Rickettsia rickettsii: Secondary | ICD-10-CM | POA: Insufficient documentation

## 2013-08-27 DIAGNOSIS — Z0000 Encounter for general adult medical examination without abnormal findings: Secondary | ICD-10-CM

## 2013-08-27 NOTE — Patient Instructions (Addendum)
Thank you for coming in today!  I think you most likely have [facility] Spotted Fever and need to complete the rest of the doxycycline that you have. You should continue getting better, and may go back to work tomorrow as long as you drink plenty of water. Call the clinic at the number below or go to the emergency room if your symptoms return.   Make a follow up appointment in about 4 weeks. Please get blood work done beforehand. You will need to be fasting for 8 hours before the blood work.    Please feel free to call with any questions or concerns at any time, at [phone].  --Dr. Jarvis Newcomer

## 2013-08-27 NOTE — Assessment & Plan Note (Signed)
On day 7 (total) of interrupted treatment for RMSF showing good response. Will complete a 14-day dose of doxycycline. Serology positive for IgG and IgM indicating previous infection vs. delayed acute titers. Definitive diagnosis would require 4-fold increase in convalescent titers 21 days after symptom onset, though I doubt this would change management. Most recent labs showed improvement in thrombocytopenia, hyponatremia, and leukocytosis. Will hold off on further investigation of any kind in light of improving labs and clinical state.

## 2013-08-27 NOTE — Progress Notes (Signed)
Subjective:     Patient ID: [name]., male   DOB: December 24, 1980, 32 y.o.   MRN: [id number]  HPI  [name]. Is a 32 year old man establishing care today with follow up for [place] spotted fever.   He presented to urgent care 8 days ago with a 1-day history of debilitating migratory arthralgias in his legs moving to his arms associated with low grade fever and headache. 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 There was no rash at that time. He began taking doxycycline for RMSF and reports minimal relief of his symptoms until Thursday where he was seen at [facility] ED, and given a provisional diagnosis of rheumatoid fever based on presence of migratory arthralgias, fever, subcutaneous nodules, and "signs of carditis on EKG." ASO titer returned negative the following day and B. Burgdorferi serology returned elevated. Treatment for rheumatic fever was discontinued and patient restarted doxycycline after a day off. He was referred to the [facility] from [place] [place] to follow up on this condition, as he had no PCP.   In the clinic today he reports tremendous improvement in his overall condition on day 7 of doxycycline. Pt states this is the first day he has not needed crutches for assistance with walking. He endorses occasional discomfort in his ankles and some improving fatigue. He has been taking naproxen during the day and vicodin a couple of nights for pain, but denies fever, rash, myalgias, HA, involuntary movements, SOB, CP, N/V/D, signs of bleeding.   Pt is a current everyday smoker.   Review of Systems As above. All other systems reviewed and are negative.      Objective:   Physical Exam  Constitutional: He is oriented to person, place, and time. He appears well-developed and well-nourished.  HENT:   Mouth/Throat: Oropharynx is clear and moist. No oropharyngeal exudate.  Eyes: Conjunctivae and EOM are normal.  Neck: Neck supple.  Cardiovascular: Normal rate.  Exam reveals no gallop and no friction rub.   No murmur heard. Pulmonary/Chest: Effort normal and breath sounds normal.  Abdominal: Soft. Bowel sounds are normal. He exhibits no distension. There is no tenderness.  Musculoskeletal: Normal range of motion. He exhibits no edema.  No joint swelling. No subcutaneous nodules appreciated.   Lymphadenopathy:    He has no cervical adenopathy.  Neurological: He is alert and oriented to person, place, and time.  Gait and speech normal  Skin: Skin is warm and dry. No rash noted.   ECG 9/18 reviewed. My personal interpretation is: regular rate and rhythm, normal axis, no conduction delay, and diffuse ST elevations consistent with benign early repolarization which is unchanged from prior tracings.     Assessment:     [name]. is a 32 y.o. 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      Plan:     On day 7 (total) of interrupted treatment for RMSF showing good response. Will complete a 14-day dose of doxycycline. Pt advised of photosensitivity, as he will be returning to work tomorrow. Serology positive for IgG and IgM indicating previous infection vs. delayed acute titers. Definitive diagnosis would require 4-fold increase in convalescent titers 21 days after symptom onset, though I doubt this would change management. Most recent labs showed improvement in thrombocytopenia, hyponatremia, and leukocytosis.  Will hold off on further investigation of any kind in light of improving labs and clinical state.   Follow up in 4 weeks for healthcare maintenance. Lipid panel ordered.

## 2013-08-28 NOTE — ED Provider Notes (Signed)
Medical screening examination/treatment/procedure(s) were performed by non-physician practitioner and as supervising physician I was immediately available for consultation/collaboration.   Roney Marion, MD 08/28/13 [phone]

## 2013-10-04 ENCOUNTER — Ambulatory Visit: Payer: BC Managed Care – PPO | Admitting: Family Medicine

## 2013-10-07 NOTE — ED Notes (Signed)
Patient called and he needs to have FMLA papers filled out.

## 2018-08-20 ENCOUNTER — Encounter (HOSPITAL_COMMUNITY): Payer: Self-pay | Admitting: Emergency Medicine

## 2018-08-20 ENCOUNTER — Emergency Department (HOSPITAL_COMMUNITY)
Admission: EM | Admit: 2018-08-20 | Discharge: 2018-08-21 | Disposition: A | Discharge status: Other Acute Inpatient Hospital | Payer: BC Managed Care – PPO | Attending: Emergency Medicine | Admitting: Emergency Medicine

## 2018-08-20 ENCOUNTER — Emergency Department (HOSPITAL_COMMUNITY): Payer: BC Managed Care – PPO

## 2018-08-20 ENCOUNTER — Other Ambulatory Visit: Payer: Self-pay

## 2018-08-20 DIAGNOSIS — R45851 Suicidal ideations: Secondary | ICD-10-CM

## 2018-08-20 DIAGNOSIS — F329 Major depressive disorder, single episode, unspecified: Secondary | ICD-10-CM | POA: Diagnosis present

## 2018-08-20 DIAGNOSIS — F1010 Alcohol abuse, uncomplicated: Secondary | ICD-10-CM | POA: Diagnosis not present

## 2018-08-20 DIAGNOSIS — F332 Major depressive disorder, recurrent severe without psychotic features: Secondary | ICD-10-CM | POA: Insufficient documentation

## 2018-08-20 DIAGNOSIS — F17210 Nicotine dependence, cigarettes, uncomplicated: Secondary | ICD-10-CM | POA: Diagnosis not present

## 2018-08-20 DIAGNOSIS — F1410 Cocaine abuse, uncomplicated: Secondary | ICD-10-CM | POA: Diagnosis not present

## 2018-08-20 DIAGNOSIS — J45909 Unspecified asthma, uncomplicated: Secondary | ICD-10-CM | POA: Insufficient documentation

## 2018-08-20 DIAGNOSIS — R079 Chest pain, unspecified: Secondary | ICD-10-CM

## 2018-08-20 DIAGNOSIS — G4700 Insomnia, unspecified: Secondary | ICD-10-CM | POA: Diagnosis not present

## 2018-08-20 LAB — COMPREHENSIVE METABOLIC PANEL
ALBUMIN: 4.4 g/dL (ref 3.5–5.0)
ALT: 44 U/L (ref 0–44)
AST: 49 U/L — AB (ref 15–41)
Alkaline Phosphatase: 73 U/L (ref 38–126)
Anion gap: 13 (ref 5–15)
BILIRUBIN TOTAL: 0.8 mg/dL (ref 0.3–1.2)
BUN: 10 mg/dL (ref 6–20)
CHLORIDE: 100 mmol/L (ref 98–111)
CO2: 26 mmol/L (ref 22–32)
Calcium: 9.5 mg/dL (ref 8.9–10.3)
Creatinine, Ser: 0.67 mg/dL (ref 0.61–1.24)
GFR calc Af Amer: >60 mL/min (ref >60)
GFR calc non Af Amer: >60 mL/min (ref >60)
GLUCOSE: 103 mg/dL — AB (ref 70–99)
POTASSIUM: 3.6 mmol/L (ref 3.5–5.1)
Sodium: 139 mmol/L (ref 135–145)
Total Protein: 8 g/dL (ref 6.5–8.1)

## 2018-08-20 LAB — CBC
HEMATOCRIT: 48.6 % (ref 39.0–52.0)
HEMOGLOBIN: 17.3 g/dL — AB (ref 13.0–17.0)
MCH: 31.7 pg (ref 26.0–34.0)
MCHC: 35.6 g/dL (ref 30.0–36.0)
MCV: 89.2 fL (ref 78.0–100.0)
Platelets: 451 10*3/uL — ABNORMAL HIGH (ref 150–400)
RBC: 5.45 MIL/uL (ref 4.22–5.81)
RDW: 12.9 % (ref 11.5–15.5)
WBC: 9 10*3/uL (ref 4.0–10.5)

## 2018-08-20 LAB — TROPONIN I: Troponin I: <0.03 ng/mL (ref <0.03)

## 2018-08-20 LAB — ACETAMINOPHEN LEVEL: Acetaminophen (Tylenol), Serum: <10 ug/mL — ABNORMAL LOW (ref 10–30)

## 2018-08-20 LAB — RAPID URINE DRUG SCREEN, HOSP PERFORMED
AMPHETAMINES: NOT DETECTED
BARBITURATES: NOT DETECTED
BENZODIAZEPINES: NOT DETECTED
Cocaine: POSITIVE — AB
Opiates: NOT DETECTED
Tetrahydrocannabinol: NOT DETECTED

## 2018-08-20 LAB — ETHANOL: Alcohol, Ethyl (B): 190 mg/dL — ABNORMAL HIGH (ref <10)

## 2018-08-20 LAB — SALICYLATE LEVEL: Salicylate Lvl: <7 mg/dL (ref 2.8–30.0)

## 2018-08-20 MED ORDER — NICOTINE 21 MG/24HR TD PT24
21.0000 mg | MEDICATED_PATCH | Freq: Every day | TRANSDERMAL | Status: DC
  Administered 2018-08-20 – 2018-08-21 (×2): 21 mg via TRANSDERMAL
  Filled 2018-08-20 (×2): qty 1

## 2018-08-20 MED ORDER — VITAMIN B-1 100 MG PO TABS
100.0000 mg | ORAL_TABLET | Freq: Every day | ORAL | Status: DC
  Administered 2018-08-20 – 2018-08-21 (×2): 100 mg via ORAL
  Filled 2018-08-20 (×2): qty 1

## 2018-08-20 MED ORDER — LORAZEPAM 2 MG/ML IJ SOLN: 0.0000 mg | Freq: Four times a day (QID) | INTRAMUSCULAR | Status: DC

## 2018-08-20 MED ORDER — THIAMINE HCL 100 MG/ML IJ SOLN: 100.0000 mg | Freq: Every day | INTRAMUSCULAR | Status: DC

## 2018-08-20 MED ORDER — IBUPROFEN 200 MG PO TABS: 600.0000 mg | ORAL_TABLET | Freq: Three times a day (TID) | ORAL | Status: DC | PRN

## 2018-08-20 MED ORDER — ONDANSETRON HCL 4 MG PO TABS: 4.0000 mg | ORAL_TABLET | Freq: Three times a day (TID) | ORAL | Status: DC | PRN

## 2018-08-20 MED ORDER — LORAZEPAM 1 MG PO TABS
0.0000 mg | ORAL_TABLET | Freq: Four times a day (QID) | ORAL | Status: DC
  Administered 2018-08-20 – 2018-08-21 (×3): 1 mg via ORAL
  Filled 2018-08-20 (×3): qty 1

## 2018-08-20 MED ORDER — ZOLPIDEM TARTRATE 5 MG PO TABS
5.0000 mg | ORAL_TABLET | Freq: Every evening | ORAL | Status: DC | PRN
  Administered 2018-08-20: 5 mg via ORAL
  Filled 2018-08-20: qty 1

## 2018-08-20 MED ORDER — ALUM & MAG HYDROXIDE-SIMETH 200-200-20 MG/5ML PO SUSP: 30.0000 mL | Freq: Four times a day (QID) | ORAL | Status: DC | PRN

## 2018-08-20 MED ORDER — LORAZEPAM 2 MG/ML IJ SOLN: 0.0000 mg | Freq: Two times a day (BID) | INTRAMUSCULAR | Status: DC

## 2018-08-20 MED ORDER — LORAZEPAM 1 MG PO TABS: 0.0000 mg | ORAL_TABLET | Freq: Two times a day (BID) | ORAL | Status: DC

## 2018-08-20 NOTE — ED Notes (Signed)
Bed: WLPT3 Expected date:  Expected time:  Means of arrival:  Comments: 

## 2018-08-20 NOTE — ED Provider Notes (Signed)
[facility]-EMERGENCY DEPT Provider Note   CSN: [id number] Arrival date & time: 08/20/18  1140     History   Chief Complaint Chief Complaint  Patient presents with  . Suicidal  . detox    HPI [name]. is a 37 y.o. male with a history of tobacco abuse, asthma, and EtOH abuse presents to the ED with a friend for SI as well as substance abuse.  Patient states he has been taking alcohol daily for the past several months just short of a year- prior to this was not a daily drinker.  He reports that he drinks 2 cases of beer as well as a fifth of liquor on a daily basis.  He reports his last alcoholic beverage was a few hours prior to arrival.  He also reports occasional cocaine use with last use being around 330 this morning.  States that he will get occasional chest pain specifically after cocaine use.  He did have some chest pain this morning after last use of cocaine but has been chest pain-free for several hours now.  Other than cocaine no specific alleviating or aggravating factors to his chest discomfort.  Pain is not pleuritic or exertional.  He is pain-free now.  He denies history of withdrawal from alcohol, hx of DTs, or hx of hospital admission related to alcohol withdrawal.  He reports that he has been having a hard time recently which has been contributing to his substance abuse.  He states that over the past 1 week has he developed suicidal thoughts without specific plan.  He is also had some vague homicidal thoughts but not about anyone specifically.  He denies hallucinations.  He denies fever, chills, nausea, vomiting, lightheadedness, dizziness, diaphoresis, shortness of breath, or syncope.  HPI  Past Medical History:  Diagnosis Date  . Asthma     Patient Active Problem List   Diagnosis Date Noted  . 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 No pertinent surgical history.      Home Medications    Prior to Admission  medications   Medication Sig Start Date End Date Taking? Authorizing Provider  aspirin EC 325 MG tablet Take 4 tablets (1,300 mg total) by mouth every 6 (six) hours. Patient not taking: Reported on 08/20/2018 08/22/13   Rolland Porter, MD  doxycycline (VIBRAMYCIN) 100 MG capsule Take 1 capsule (100 mg total) by mouth 2 (two) times daily. Patient not taking: Reported on 08/20/2018 08/19/13   Renne Crigler, PA-C  HYDROcodone-acetaminophen (NORCO/VICODIN) 5-325 MG per tablet Take 1-2 tablets every 6 hours as needed for severe pain Patient not taking: Reported on 08/20/2018 08/19/13   Renne Crigler, PA-C  HYDROcodone-acetaminophen (NORCO/VICODIN) 5-325 MG per tablet Take 1 tablet by mouth every 4 (four) hours as needed for pain. Patient not taking: Reported on 08/20/2018 08/22/13   Rolland Porter, MD  naproxen (NAPROSYN) 500 MG tablet Take 1 tablet (500 mg total) by mouth 2 (two) times daily. Patient not taking: Reported on 08/20/2018 08/19/13   Renne Crigler, PA-C  penicillin v potassium (VEETID) 500 MG tablet Take 1 tablet (500 mg total) by mouth 4 (four) times daily. Patient not taking: Reported on 08/20/2018 08/22/13   Rolland Porter, MD    Family History Family History  Problem Relation Age of Onset  . Diabetes Other   . Hypertension Other     Social History Social History   Tobacco Use  . Smoking status: Current Every Day Smoker  Packs/day: 2.00    Types: Cigarettes  . Smokeless tobacco: Never Used  Substance Use Topics  . Alcohol use: Yes    Comment: a lot  . Drug use: Yes    Types: Cocaine, Marijuana     Allergies   Patient has no known allergies.   Review of Systems Review of Systems  Constitutional: Negative for chills, diaphoresis and fever.  Respiratory: Negative for shortness of breath.   Cardiovascular: Positive for chest pain (resolved at present). Negative for palpitations and leg swelling.  Gastrointestinal: Negative for abdominal pain, blood in stool, constipation,  diarrhea, nausea and vomiting.  Neurological: Negative for dizziness, syncope, weakness and numbness.  Psychiatric/Behavioral: Positive for suicidal ideas. Negative for hallucinations.  All other systems reviewed and are negative.  Physical Exam Updated Vital Signs BP (!) 152/107 (BP Location: Left Arm) Comment: crying  Pulse 89   Temp 98.3 F (36.8 C) (Oral)   Resp 18   SpO2 95%   Physical Exam  Constitutional: He appears well-developed and well-nourished.  Non-toxic appearance. No distress.  HENT:  Head: Normocephalic and atraumatic.  Eyes: Conjunctivae are normal. Right eye exhibits no discharge. Left eye exhibits no discharge.  Neck: Neck supple.  Cardiovascular: Normal rate, regular rhythm and intact distal pulses.  No murmur heard. Pulses:      Radial pulses are 2+ on the right side, and 2+ on the left side.  Pulmonary/Chest: Effort normal and breath sounds normal. No respiratory distress. He has no wheezes. He has no rhonchi. He has no rales.  Respiration even and unlabored  Abdominal: Soft. He exhibits no distension. There is no tenderness.  Musculoskeletal: He exhibits no edema or tenderness.  Neurological: He is alert.  Clear speech.   Skin: Skin is warm and dry. No rash noted.  Psychiatric: His speech is normal and behavior is normal. He is not actively hallucinating. He expresses homicidal and suicidal ideation.  Patient tearful throughout exam.   Nursing note and vitals reviewed.   ED Treatments / Results  Labs (all labs ordered are listed, but only abnormal results are displayed) Labs Reviewed  COMPREHENSIVE METABOLIC PANEL - Abnormal; Notable for the following components:      Result Value   Glucose, Bld 103 (*)    AST 49 (*)    All other components within normal limits  ETHANOL - Abnormal; Notable for the following components:   Alcohol, Ethyl (B) 190 (*)    All other components within normal limits  ACETAMINOPHEN LEVEL - Abnormal; Notable for the  following components:   Acetaminophen (Tylenol), Serum <10 (*)    All other components within normal limits  CBC - Abnormal; Notable for the following components:   Hemoglobin 17.3 (*)    Platelets 451 (*)    All other components within normal limits  RAPID URINE DRUG SCREEN, HOSP PERFORMED - Abnormal; Notable for the following components:   Cocaine POSITIVE (*)    All other components within normal limits  SALICYLATE LEVEL  TROPONIN I  TROPONIN I    EKG EKG Interpretation  Date/Time:  Monday August 20 2018 13:10:48 EDT Ventricular Rate:  80 PR Interval:    QRS Duration: 101 QT Interval:  389 QTC Calculation: 449 R Axis:   88 Text Interpretation:  Sinus arrhythmia ST elev, probable normal early repol pattern Baseline wander in lead(s) V2 Confirmed by Kristine RoyalMessick, Peter [phone]([id number]) on 08/20/2018 1:13:44 PM Also confirmed by Kristine RoyalMessick, Peter [phone]), editor Barbette Hairassel, Kerry [phone])  on 08/20/2018 3:04:00 PM  Radiology Dg Chest 2 View  Result Date: 08/20/2018 CLINICAL DATA:  Chest pain. EXAM: CHEST - 2 VIEW COMPARISON:  04/19/2010 FINDINGS: The heart size and mediastinal contours are within normal limits. Both lungs are clear. The visualized skeletal structures are unremarkable. IMPRESSION: Normal exam. Electronically Signed   By: Francene Boyers M.D.   On: 08/20/2018 13:06    Procedures Procedures (including critical care time)  Medications Ordered in ED Medications - No data to display   Initial Impression / Assessment and Plan / ED Course  I have reviewed the triage vital signs and the nursing notes.  Pertinent labs & imaging results that were available during my care of the patient were reviewed by me and considered in my medical decision making (see chart for details).   Patient presents to the ED for suicidal ideations and desire for assistance with substance abuse.  Patient is nontoxic-appearing, no apparent distress, vitals notable for elevated blood pressure initially, doubt  HTN emergency, normalized on repeat vitals.  Patient had a fairly benign physical exam.  He does mention that he has had some episodic chest pain specifically following cocaine use that is typically fairly brief- most recently occurred around 3:30 this AM, he has been chest pain free for several hours. Will further evaluate with psychiatric screening labs as well as troponin, EKG, and CXR.   Patient's screening labs have been reviewed: Patient has no leukocytosis.  His hemoglobin and platelets are somewhat elevated at 17.3 and 451 respectively-similar to prior labs on record.  No significant electrolyte disturbance.  Ethanol level and UDS consistent with reported substance use.   Specifically regarding patient's chest pain: No laboratory explanation per above. Patient has a low risk heart score, EKG without obvious ischemia, delta troponin negative, doubt ACS.  Patient low risk Wells, PERC negative.  No widening of mediastinum on chest x-ray, no pain at present, symmetric pulses, doubt dissection.  Chest x-ray without pneumothorax, effusion, edema, infiltrate, or bony abnormality.  Suspect patient's chest pain is related to cocaine use, no active pain at present - therefore do not suspect active vasospastic angina.   Patient medically cleared for TTS evaluation.  Holding orders have been placed with CIWA Protocol, patient currently with elevated EtOH level, no signs of current withdrawal. Disposition per behavioral health.  Additionally have placed consult for peers support given history of substance abuse.   Vitals:   08/20/18 1549 08/20/18 1550  BP: 130/70 130/70  Pulse: 76 76  Resp:  18  Temp:    SpO2:  100%   Final Clinical Impressions(s) / ED Diagnoses   Final diagnoses:  Suicidal ideation  Alcohol abuse  Cocaine abuse (HCC)  Chest pain, unspecified type    ED Discharge Orders    None       Cherly Anderson, PA-C 08/20/18 1644    Wynetta Fines, MD 08/20/18 2046

## 2018-08-20 NOTE — ED Triage Notes (Signed)
Pt brought in by friend for SI without specific plan for couple weeks. Pt reports that he drinks a lot of ETOH and uses cocaine daily. Pt reports that he drinks liquor and beer and cant even estimate the amount he consumes on daily basis.  Reports that he has been sober before but been drinking heavily for the past several months.

## 2018-08-20 NOTE — ED Notes (Signed)
Patient transported to X-ray 

## 2018-08-20 NOTE — ED Notes (Signed)
Pt to room #43. Pt reports wanting rehab for drugs and alcohol. Denies SI/HI/AVH. Encouragement and support provided. Special checks q 15 mins in place for safety, video monitoring in place. Will continue to monitor.

## 2018-08-20 NOTE — ED Notes (Signed)
Peer support at bedside 

## 2018-08-20 NOTE — ED Notes (Signed)
Pt A&O x 3, no distress noted, cooperative, slightly anxious.  Monitoring for safety, Q 15 min checks in effect.

## 2018-08-20 NOTE — BH Assessment (Addendum)
Assessment Note  [name]. is an 37 y.o. male that presents this date with S/I and a plan to cut his wrists. Patient denies any  H/I or AVH. Patient states he is "done with it all" after loosing his employment last week and recent marital issues associated with his SA use. Patient reports ongoing alcohol and cocaine use to include: daily use of alcohol stating he had been consuming over 12 beers daily with last use earlier this date when he states he "drank 10 beers." Patient also has a history of cocaine use with patient reporting he uses over 1 gram three to four times a month with last use earlier this date when he used one half gram. Patient reports current withdrawals to include: tremors agitation and nausea. Patient is oriented x 4 and is observed to be tearful at times as he renders his history. Patient is a poor historian and is noted to be impaired at the time of assessment. Patient's BAL was 190 on admission and UDS positive for cocaine. Patient denies any previous attempts/gestures at self harm although states he was diagnosed with depression 15 years ago and was hospitalized for that incident although denies that incident was associated with any S/I. Patient denies any OP treatment since then or medication interventions. Patient reports worsening depression over the last month with symptoms to include: feeling worthless and anhedonia. Per notes, "Patient brought in by friend for SI without specific plan for a couple weeks. Patient reports that he drinks a lot of ETOH and uses cocaine daily. Patient reports that he drinks liquor and beer and can't even estimate the amount he consumes on daily basis. Case was staffed with Shaune Pollack DNP who recommended patient be observed and monitored for safety. Patient will be seen by psychiatry in the a.m.    Diagnosis: F33.2 MDD recurrent without psychotic features, severe Polysubstance use    Past Medical History:  Past Medical History:  Diagnosis Date  .  Asthma     History reviewed. No pertinent surgical history.  Family History:  Family History  Problem Relation Age of Onset  . Diabetes Other   . Hypertension Other     Social History:  reports that he has been smoking cigarettes. He has been smoking about 2.00 packs per day. He has never used smokeless tobacco. He reports that he drinks alcohol. He reports that he has current or past drug history. Drugs: Cocaine and Marijuana.  Additional Social History:  Alcohol / Drug Use Pain Medications: See MAR Prescriptions: See MAR Over the Counter: See MAR History of alcohol / drug use?: Yes Longest period of sobriety (when/how long): Unknown Negative Consequences of Use: Financial, Personal relationships Withdrawal Symptoms: Agitation, Weakness, Tremors Substance #1 Name of Substance 1: Cocaine  1 - Age of First Use: 26 1 - Amount (size/oz): 1 gram  1 - Frequency: Three to four times a month 1 - Duration: Last 5 years 1 - Last Use / Amount: 08/20/18 1/2 gram Substance #2 Name of Substance 2: Alcohol 2 - Age of First Use: 20 2 - Amount (size/oz): Pt reports different amounts 2 - Frequency: daily 2 - Duration: Last ten years 2 - Last Use / Amount: 08/20/18 12 12  oz beers  CIWA: CIWA-Ar BP: (!) 147/102 Pulse Rate: 71 COWS:    Allergies: No Known Allergies  Home Medications:  (Not in a hospital admission)  OB/GYN Status:  No LMP for male patient.  General Assessment Data Location of Assessment: [facility] ED TTS  Assessment: In system Is this a Tele or Face-to-Face Assessment?: Face-to-Face Is this an Initial Assessment or a Re-assessment for this encounter?: Initial Assessment Patient Accompanied by:: (NA) Language Other than English: No Living Arrangements: (Spouse) What gender do you identify as?: Male Marital status: Married Artist name: NA Pregnancy Status: No Living Arrangements: Spouse/significant other Can pt return to current living arrangement?: Yes Admission  Status: Voluntary Is patient capable of signing voluntary admission?: Yes Referral Source: Self/Family/Friend Insurance type: Self Pay  Medical Screening Exam [facility] Walk-in ONLY) Medical Exam completed: Yes  Crisis Care Plan Living Arrangements: Spouse/significant other Legal Guardian: (NA) Name of Psychiatrist: None Name of Therapist: None  Education Status Is patient currently in school?: No Is the patient employed, unemployed or receiving disability?: Unemployed  Risk to self with the past 6 months Suicidal Ideation: Yes-Currently Present Has patient been a risk to self within the past 6 months prior to admission? : No Suicidal Intent: Yes-Currently Present Has patient had any suicidal intent within the past 6 months prior to admission? : No Is patient at risk for suicide?: Yes Suicidal Plan?: No Has patient had any suicidal plan within the past 6 months prior to admission? : No Access to Means: No What has been your use of drugs/alcohol within the last 12 months?: Current use Previous Attempts/Gestures: No How many times?: 0 Other Self Harm Risks: (NA) Triggers for Past Attempts: (Excessive SA use) Intentional Self Injurious Behavior: None Family Suicide History: No Recent stressful life event(s): Job Loss Persecutory voices/beliefs?: No Depression: Yes Depression Symptoms: Guilt, Feeling worthless/self pity Substance abuse history and/or treatment for substance abuse?: Yes Suicide prevention information given to non-admitted patients: Not applicable  Risk to Others within the past 6 months Homicidal Ideation: No Does patient have any lifetime risk of violence toward others beyond the six months prior to admission? : No Thoughts of Harm to Others: No Current Homicidal Intent: No Current Homicidal Plan: No Access to Homicidal Means: No Identified Victim: NA History of harm to others?: No Assessment of Violence: None Noted Violent Behavior Description: NA Does  patient have access to weapons?: No Criminal Charges Pending?: No Does patient have a court date: No Is patient on probation?: No  Psychosis Hallucinations: None noted Delusions: None noted  Mental Status Report Appearance/Hygiene: In scrubs Eye Contact: Fair Motor Activity: Freedom of movement Speech: Slow, Slurred Level of Consciousness: Drowsy Mood: Depressed Affect: Appropriate to circumstance Anxiety Level: Minimal Thought Processes: Thought Blocking Judgement: Impaired Orientation: Person, Place, Time Obsessive Compulsive Thoughts/Behaviors: None  Cognitive Functioning Concentration: Decreased Memory: Recent Intact, Remote Intact Is patient IDD: No Insight: Fair Impulse Control: Poor Appetite: Fair Have you had any weight changes? : No Change Sleep: Decreased Total Hours of Sleep: 2 Vegetative Symptoms: None  ADLScreening [facility] Assessment Services) Patient's cognitive ability adequate to safely complete daily activities?: Yes Patient able to express need for assistance with ADLs?: Yes Independently performs ADLs?: Yes (appropriate for developmental age)  Prior Inpatient Therapy Prior Inpatient Therapy: Yes Prior Therapy Dates: (15 years ago ) Prior Therapy Facilty/Provider(s): (Pt cannot recall) Reason for Treatment: MH issues  Prior Outpatient Therapy Prior Outpatient Therapy: No Does patient have an ACCT team?: No Does patient have Intensive In-House Services?  : No Does patient have [facility] services? : No Does patient have P4CC services?: No  ADL Screening (condition at time of admission) Patient's cognitive ability adequate to safely complete daily activities?: Yes Is the patient deaf or have difficulty hearing?: No Does the patient have  difficulty seeing, even when wearing glasses/contacts?: No Does the patient have difficulty concentrating, remembering, or making decisions?: No Patient able to express need for assistance with ADLs?: Yes Does the  patient have difficulty dressing or bathing?: No Independently performs ADLs?: Yes (appropriate for developmental age) Does the patient have difficulty walking or climbing stairs?: No Weakness of Legs: None Weakness of Arms/Hands: None  Home Assistive Devices/Equipment Home Assistive Devices/Equipment: None  Therapy Consults (therapy consults require a physician order) PT Evaluation Needed: No OT Evalulation Needed: No SLP Evaluation Needed: No Abuse/Neglect Assessment (Assessment to be complete while patient is alone) Physical Abuse: Denies Verbal Abuse: Denies Sexual Abuse: Denies Exploitation of patient/patient's resources: Denies Self-Neglect: Denies Values / Beliefs Cultural Requests During Hospitalization: None Spiritual Requests During Hospitalization: None Consults Spiritual Care Consult Needed: No Social Work Consult Needed: No Merchant navy officerAdvance Directives (For Healthcare) Does Patient Have a Medical Advance Directive?: No Would patient like information on creating a medical advance directive?: No - Patient declined          Disposition: Case was staffed with Shaune PollackLord DNP who recommended patient be observed and monitored for safety. Patient will be seen by psychiatry in the a.m.   Disposition Initial Assessment Completed for this Encounter: Yes Disposition of Patient: (Observe and monitor ) Patient refused recommended treatment: No Mode of transportation if patient is discharged?: Loreli Slot(UNK)  On Site Evaluation by:   Reviewed with Physician:    Alfredia Fergusonavid L Mohd Clemons 08/20/2018 2:49 PM

## 2018-08-20 NOTE — BH Assessment (Signed)
[facility] Assessment Progress Note Case was staffed with Lord DNP who recommended patient be observed and monitored for safety. Patient will be seen by psychiatry in the a.m.     

## 2018-08-20 NOTE — Patient Outreach (Signed)
ED Peer Support Specialist Patient Intake (Complete at intake & 30-60 Day Follow-up)  Name: [name].  MRN: [id number]  Age: 37 y.o.   Date of Admission: 08/20/2018  Intake: Initial Comments:      Primary Reason Admitted: is an 37 y.o. male that presents this date with S/I and a plan to cut his wrists. Patient denies any  H/I or AVH. Patient states he is "done with it all" after loosing his employment last week and recent marital issues associated with his SA use. Patient reports ongoing alcohol and cocaine use to include: daily use of alcohol stating he had been consuming over 12 beers daily with last use earlier this date when he states he "drank 10 beers." Patient also has a history of cocaine use with patient reporting he uses over 1 gram three to four times a month with last use earlier this date when he used one half gram. Patient reports current withdrawals to include: tremors agitation and nausea. Patient is oriented x 4 and is observed to be tearful at times as he renders his history. Patient is a poor historian and is noted to be impaired at the time of assessment. Patient's BAL was 190 on admission and UDS positive for cocaine. Patient denies any previous attempts/gestures at self harm although states he was diagnosed with depression 15 years ago and was hospitalized for that incident although denies that incident was associated with any S/I. Patient denies any OP treatment since then or medication interventions. Patient reports worsening depression over the last month with symptoms to include: feeling worthless and anhedonia. Per notes, "Patient brought in by friend for SI without specific plan for a couple weeks. Patient reports that he drinks a lot of ETOH and uses cocaine daily. Patient reports that he drinks liquor and beer and can't even estimate the amount he consumes on daily basis. Case was staffed with Reita Cliche DNP who recommended patient be observed and monitored for safety. Patient  will be seen by psychiatry in the a.m.    Lab values: Alcohol/ETOH: Positive Positive UDS?   Amphetamines: No Barbiturates: No Benzodiazepines: No Cocaine: Yes Opiates: No Cannabinoids: No  Demographic information: Gender: Male Ethnicity: African American Marital Status: Single Insurance Status: Diplomatic Services operational officer (Work Neurosurgeon, Physicist, medical, etc.: No Lives with: Partner/Spouse Living situation: House/Apartment  Reported Patient History: Patient reported health conditions: Depression Patient aware of HIV and hepatitis status:    In past year, has patient visited ED for any reason? No  Number of ED visits:    Reason(s) for visit:    In past year, has patient been hospitalized for any reason? No  Number of hospitalizations:    Reason(s) for hospitalization:    In past year, has patient been arrested? No  Number of arrests:    Reason(s) for arrest:    In past year, has patient been incarcerated? No  Number of incarcerations:    Reason(s) for incarceration:    In past year, has patient received medication-assisted treatment? No  In past year, patient received the following treatments:    In past year, has patient received any harm reduction services? No  Did this include any of the following?    In past year, has patient received care from a mental health provider for diagnosis other than SUD? No  In past year, is this first time patient has overdosed? No  Number of past overdoses:    In past year, is this first time patient  has been hospitalized for an overdose? No  Number of hospitalizations for overdose(s):    Is patient currently receiving treatment for a mental health diagnosis? No  Patient reports experiencing difficulty participating in SUD treatment: No    Most important reason(s) for this difficulty?    Has patient received prior services for treatment? No  In past, patient has received services  from following agencies:    Plan of Care:  Suggested follow up at these agencies/treatment centers: [facility]Stated that he wants treatment )  Other information: [facility] met with Pt and processed with Pt to gain information as to what brought Pt into the facility. CPSS was able to use substance recovery based tools to help Pt. [facility]PSS was able to talk with Pt to understand what service will better benefit Pt for substance abuse assistance. CPSS was able to get a consent form signed to fax out Pt to a few facilities. [facility]PSS is waiting to receive feedback from [facility] .   Aaron Edelman Kynsie Falkner, [place]  08/20/2018 4:43 PM

## 2018-08-21 DIAGNOSIS — F1010 Alcohol abuse, uncomplicated: Secondary | ICD-10-CM

## 2018-08-21 DIAGNOSIS — G4700 Insomnia, unspecified: Secondary | ICD-10-CM

## 2018-08-21 DIAGNOSIS — F17210 Nicotine dependence, cigarettes, uncomplicated: Secondary | ICD-10-CM

## 2018-08-21 NOTE — Consult Note (Addendum)
[facility] Psych ED Discharge  08/21/2018 12:08 PM [name].  MRN:  [id number] Principal Problem: Alcohol abuse Discharge Diagnoses:  Patient Active Problem List   Diagnosis Date Noted  . [place] spotted fever [A77.0] 08/27/2013    HPI: [name]. is an 37 y.o. male that presents this date with S/I and a plan to cut his wrists. Patient denies any  H/I or AVH. Patient states he is "done with it all" after loosing his employment last week and recent marital issues associated with his SA use. Patient reports ongoing alcohol and cocaine use to include: daily use of alcohol stating he had been consuming over 12 beers daily with last use earlier this date when he states he "drank 10 beers." Patient also has a history of cocaine use with patient reporting he uses over 1 gram three to four times a month with last use earlier this date when he used one half gram. Patient reports current withdrawals to include: tremors agitation and nausea. Patient is oriented x 4 and is observed to be tearful at times as he renders his history. Patient is a poor historian and is noted to be impaired at the time of assessment. Patient's BAL was 190 on admission and UDS positive for cocaine. Patient denies any previous attempts/gestures at self harm although states he was diagnosed with depression 15 years ago and was hospitalized for that incident although denies that incident was associated with any SI. Patient denies any OP treatment since then or medication interventions. Patient reports worsening depression over the last month with symptoms to include: feeling worthless and anhedonia. Per notes, "Patient brought in by friend for SI without specific plan for a couple weeks. Patient reports that he drinks a lot of ETOH and uses cocaine daily. Patient reports that he drinks liquor and beer and can't even estimate the amount he consumes on daily basis  Subjective: Today, [name] reports that he is feeling better. He  denies withdrawal side effects. He denies SI, HI or AVH. He would like to receive rehab for alcohol abuse. 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   Total Time spent with patient: 20 minutes  Past Psychiatric History: MDD severe, Polysubstance abuse  Past Medical History:  Past Medical History:  Diagnosis Date  . Asthma    History reviewed. No pertinent surgical history. Family History:  Family History  Problem Relation Age of Onset  . Diabetes Other   . Hypertension Other    Family Psychiatric  History: Denies  Social History:  Social History   Substance and Sexual Activity  Alcohol Use Yes   Comment: a lot    Social History   Substance and Sexual Activity  Drug Use Yes  . Types: Cocaine, Marijuana   Social History   Socioeconomic History  . Marital status: Single    Spouse name: Not on file  . Number of children: Not on file  . Years of education: Not on file  . Highest education level: Not on file  Occupational History  . Not on file  Social Needs  . Financial resource strain: Not on file  . Food insecurity:    Worry: Not on file    Inability: Not on file  . Transportation needs:    Medical: Not on file    Non-medical: Not on file  Tobacco Use  . Smoking status: Current Every Day Smoker  Packs/day: 2.00    Types: Cigarettes  . Smokeless tobacco: Never Used  Substance and Sexual Activity  . Alcohol use: Yes    Comment: a lot  . Drug use: Yes    Types: Cocaine, Marijuana  . Sexual activity: Not on file  Lifestyle  . Physical activity:    Days per week: Not on file    Minutes per session: Not on file  . Stress: Not on file  Relationships  . Social connections:    Talks on phone: Not on file    Gets together: Not on file    Attends religious service: Not on file    Active member of club or organization: Not on file    Attends meetings of clubs or organizations: Not on file     Relationship status: Not on file  Other Topics Concern  . Not on file  Social History Narrative  . Not on file    Has this patient used any form of tobacco in the last 30 days? (Cigarettes, Smokeless Tobacco, Cigars, and/or Pipes) A prescription for an FDA-approved tobacco cessation medication was offered at discharge and the patient refused  Current Medications: Current Facility-Administered Medications  Medication Dose Route Frequency Provider Last Rate Last Dose  . alum & mag hydroxide-simeth (MAALOX/MYLANTA) 200-200-20 MG/5ML suspension 30 mL  30 mL Oral Q6H PRN Petrucelli, Samantha R, PA-C      . ibuprofen (ADVIL,MOTRIN) tablet 600 mg  600 mg Oral Q8H PRN Petrucelli, Samantha R, PA-C      . LORazepam (ATIVAN) injection 0-4 mg  0-4 mg Intravenous Q6H Petrucelli, Samantha R, PA-C       Or  . LORazepam (ATIVAN) tablet 0-4 mg  0-4 mg Oral Q6H Petrucelli, Samantha R, PA-C   Stopped at 08/21/18 0246  . [START ON 08/22/2018] LORazepam (ATIVAN) injection 0-4 mg  0-4 mg Intravenous Q12H Petrucelli, Samantha R, PA-C       Or  . [START ON 08/22/2018] LORazepam (ATIVAN) tablet 0-4 mg  0-4 mg Oral Q12H Petrucelli, Samantha R, PA-C      . nicotine (NICODERM CQ - dosed in mg/24 hours) patch 21 mg  21 mg Transdermal Daily Petrucelli, Samantha R, PA-C   21 mg at 08/21/18 0907  . ondansetron (ZOFRAN) tablet 4 mg  4 mg Oral Q8H PRN Petrucelli, Samantha R, PA-C      . thiamine (VITAMIN B-1) tablet 100 mg  100 mg Oral Daily Petrucelli, Samantha R, PA-C   100 mg at 08/21/18 [id number]   Or  . thiamine (B-1) injection 100 mg  100 mg Intravenous Daily Petrucelli, Samantha R, PA-C      . zolpidem (AMBIEN) tablet 5 mg  5 mg Oral QHS PRN Petrucelli, Samantha R, PA-C   5 mg at 08/20/18 2058   Current Outpatient Medications  Medication Sig Dispense Refill  . aspirin EC 325 MG tablet Take 4 tablets (1,300 mg total) by mouth every 6 (six) hours. (Patient not taking: Reported on 08/20/2018) 100 tablet 0  . doxycycline  (VIBRAMYCIN) 100 MG capsule Take 1 capsule (100 mg total) by mouth 2 (two) times daily. (Patient not taking: Reported on 08/20/2018) 14 capsule 0  . HYDROcodone-acetaminophen (NORCO/VICODIN) 5-325 MG per tablet Take 1-2 tablets every 6 hours as needed for severe pain (Patient not taking: Reported on 08/20/2018) 12 tablet 0  . HYDROcodone-acetaminophen (NORCO/VICODIN) 5-325 MG per tablet Take 1 tablet by mouth every 4 (four) hours as needed for pain. (Patient not taking: Reported on 08/20/2018) 10 tablet  0  . naproxen (NAPROSYN) 500 MG tablet Take 1 tablet (500 mg total) by mouth 2 (two) times daily. (Patient not taking: Reported on 08/20/2018) 20 tablet 0  . penicillin v potassium (VEETID) 500 MG tablet Take 1 tablet (500 mg total) by mouth 4 (four) times daily. (Patient not taking: Reported on 08/20/2018) 40 tablet 0   PTA Medications:  (Not in a hospital admission)  Musculoskeletal: Strength & Muscle Tone: within normal limits Gait & Station: normal Patient leans: N/A  Psychiatric Specialty Exam: Physical Exam  Nursing note and vitals reviewed. Constitutional: He is oriented to person, place, and time. He appears well-developed and well-nourished.  HENT:  Head: Normocephalic and atraumatic.  Neck: Normal range of motion.  Respiratory: Effort normal.  Musculoskeletal: Normal range of motion.  Neurological: He is alert and oriented to person, place, and time.  Psychiatric: His behavior is normal. Judgment and thought content normal. Cognition and memory are normal. He exhibits a depressed mood.    Review of Systems  Psychiatric/Behavioral: Positive for depression and substance abuse. Negative for hallucinations and suicidal ideas.  All other systems reviewed and are negative.   Blood pressure 120/87, pulse (!) 114, temperature 98.2 F (36.8 C), temperature source Oral, resp. rate 20, SpO2 98 %.There is no height or weight on file to calculate BMI.     Demographic Factors:  Male, Low  socioeconomic status and Unemployed  Loss Factors: Decrease in vocational status, Loss of significant relationship and Financial problems/change in socioeconomic status  Historical Factors: Impulsivity  Risk Reduction Factors:   Living with another person, especially a relative and Positive social support  Continued Clinical Symptoms:  Severe Anxiety and/or Agitation Depression:   Comorbid alcohol abuse/dependence Hopelessness Impulsivity Insomnia Recent sense of peace/wellbeing  Cognitive Features That Contribute To Risk:  None    Suicide Risk:  Minimal: No identifiable suicidal ideation.  Patients presenting with no risk factors but with morbid ruminations; may be classified as minimal risk based on the severity of the depressive symptoms    Plan Of Care/Follow-up recommendations:  Admission to [facility] for detox and dual diagnosis treatment.   Disposition: Admit inpatient Truman Hayward, FNP 08/21/2018, 12:08 PM   Patient seen face-to-face for psychiatric evaluation, chart reviewed and case discussed with the physician extender and developed treatment plan. Reviewed the information documented and agree with the treatment plan.  Juanetta Beets, DO 08/21/18 8:29 PM

## 2018-08-21 NOTE — ED Notes (Signed)
[facility] assessment deferred, pt currently sleeping.  

## 2018-08-21 NOTE — BH Assessment (Signed)
[facility] Assessment Progress Note      Per dr. Sharma CovertNorman, Patient can be discharged with referrals for rehab centers.

## 2018-08-21 NOTE — ED Provider Notes (Signed)
Patient on psych hold for suicidal ideation and substance abuse.  I was informed by the nurse that he has been accepted to an outside hospital.  I evaluated the patient personally and he looks clinically stable and is agreeable to transfer.   Terrilee FilesButler, Michael C, MD 08/21/18 [phone]

## 2018-08-21 NOTE — Patient Outreach (Signed)
Pt was accepted to [facility] facility by Dr Estill Cottahomas Cornwall at 12:04. Nurse to Nurse number is [phone], called was reported by Mrs Adair LaundryLatonya.

## 2018-08-21 NOTE — ED Notes (Signed)
[facility] transport on unit to transfer pt to [facility] per MD order. Personal property given to [facility] for transfer. Ambulatory off unit.

## 2019-10-07 ENCOUNTER — Emergency Department (HOSPITAL_BASED_OUTPATIENT_CLINIC_OR_DEPARTMENT_OTHER): Payer: Self-pay

## 2019-10-07 ENCOUNTER — Encounter (HOSPITAL_BASED_OUTPATIENT_CLINIC_OR_DEPARTMENT_OTHER): Payer: Self-pay | Admitting: *Deleted

## 2019-10-07 ENCOUNTER — Other Ambulatory Visit: Payer: Self-pay

## 2019-10-07 ENCOUNTER — Emergency Department (HOSPITAL_BASED_OUTPATIENT_CLINIC_OR_DEPARTMENT_OTHER)
Admission: EM | Admit: 2019-10-07 | Discharge: 2019-10-07 | Disposition: A | Discharge status: Home / Self Care | Payer: Self-pay | Attending: Emergency Medicine | Admitting: Emergency Medicine

## 2019-10-07 DIAGNOSIS — M546 Pain in thoracic spine: Secondary | ICD-10-CM | POA: Insufficient documentation

## 2019-10-07 DIAGNOSIS — J45909 Unspecified asthma, uncomplicated: Secondary | ICD-10-CM | POA: Insufficient documentation

## 2019-10-07 DIAGNOSIS — Z20828 Contact with and (suspected) exposure to other viral communicable diseases: Secondary | ICD-10-CM | POA: Insufficient documentation

## 2019-10-07 DIAGNOSIS — Z7982 Long term (current) use of aspirin: Secondary | ICD-10-CM | POA: Insufficient documentation

## 2019-10-07 DIAGNOSIS — F17210 Nicotine dependence, cigarettes, uncomplicated: Secondary | ICD-10-CM | POA: Insufficient documentation

## 2019-10-07 DIAGNOSIS — B349 Viral infection, unspecified: Secondary | ICD-10-CM | POA: Insufficient documentation

## 2019-10-07 MED ORDER — ALBUTEROL SULFATE HFA 108 (90 BASE) MCG/ACT IN AERS
2.0000 | INHALATION_SPRAY | Freq: Once | RESPIRATORY_TRACT | Status: AC
  Administered 2019-10-07: 2 via RESPIRATORY_TRACT
  Filled 2019-10-07: qty 6.7

## 2019-10-07 NOTE — ED Triage Notes (Signed)
2 days of unable to take a deep breath without having pain. Headache. Cough when he smokes. No known injury. States he does a lot of heavy lifting at work and thinks he may have pulled a muscle. Pain increases with movement.

## 2019-10-07 NOTE — Discharge Instructions (Signed)
Your chest xray was negative for any infection today. We have swabbed you for covid 19 - please stay at home and self isolate until you receive your results. If negative you can resume normal daily activity. If positive you will need to stay home and self isolate for 14 days starting today (cleared 10/22/2019).   Use inhaler as prescribed for feelings of shortness of breath. Try to cut back on cigarette use.   Monitor your symptoms at home and take Tylenol as needed for fevers.   Follow up with your PCP. If you do not have one you can follow up with [facility] for your primary care needs.

## 2019-10-07 NOTE — ED Provider Notes (Signed)
[facility] EMERGENCY DEPARTMENT Provider Note   CSN: [id number] Arrival date & time: 10/07/19  1248     History   Chief Complaint Chief Complaint  Patient presents with  . Cough    HPI [name]. is a 38 y.o. male with PMHx asthma who presents to the ED today complaining of gradual onset, constant, shortness of breath worse with exertion x 3 days.  Also complains of pain to his left mid back with twisting/movement.  He reports that 3 days ago he noticed some muscle stiffness in his back and had been trying to stretch it out as he assumed that he had pulled a muscle while doing heavy lifting at work a couple of days prior.  Patient reports that night he was taking his daughter trick-or-treating and was walking up a steep hill while smoking and began excessively coughing and feeling so short of breath that he had to stop to catch his breath.  Patient states that he felt so bad that he contemplated calling 911.  He was able to catch his breath after sitting down and decided to go home instead.  Patient states that since walking up that hill he has had pain to his mid back with movement. Also complains of an intermittent diffuse headache.  He does endorse that he was recently around his nephew about a week ago and was told a couple of days later that his nephew tested positive for COVID-19.  he did not think anything of it until he began having symptoms 3 days ago.  Patient reports he has been taking ibuprofen for his back pain with some relief.  She has never had symptoms like this before.  Denies fever, chills, sore throat, ear pain, chest pain, nausea, vomiting, diaphoresis, leg swelling, vision changes, rash, neck stiffness, confusion, unilateral weakness or numbness, any other associated symptoms.  No recent immobilization.  No history DVT/PE.  No exogenous hormone use.  No active malignancy.        Past Medical History:  Diagnosis Date  . Asthma     Patient Active Problem  List   Diagnosis Date Noted  . Alcohol abuse   . 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 No pertinent surgical history.      Home Medications    Prior to Admission medications   Medication Sig Start Date End Date Taking? Authorizing Provider  aspirin EC 325 MG tablet Take 4 tablets (1,300 mg total) by mouth every 6 (six) hours. Patient not taking: Reported on 08/20/2018 08/22/13   Tanna Furry, MD  doxycycline (VIBRAMYCIN) 100 MG capsule Take 1 capsule (100 mg total) by mouth 2 (two) times daily. Patient not taking: Reported on 08/20/2018 08/19/13   Carlisle Cater, PA-C  HYDROcodone-acetaminophen (NORCO/VICODIN) 5-325 MG per tablet Take 1-2 tablets every 6 hours as needed for severe pain Patient not taking: Reported on 08/20/2018 08/19/13   Carlisle Cater, PA-C  HYDROcodone-acetaminophen (NORCO/VICODIN) 5-325 MG per tablet Take 1 tablet by mouth every 4 (four) hours as needed for pain. Patient not taking: Reported on 08/20/2018 08/22/13   Tanna Furry, MD  naproxen (NAPROSYN) 500 MG tablet Take 1 tablet (500 mg total) by mouth 2 (two) times daily. Patient not taking: Reported on 08/20/2018 08/19/13   Carlisle Cater, PA-C  penicillin v potassium (VEETID) 500 MG tablet Take 1 tablet (500 mg total) by mouth 4 (four) times daily. Patient not taking: Reported on 08/20/2018 08/22/13   Tanna Furry, MD  Family History Family History  Problem Relation Age of Onset  . Diabetes Other   . Hypertension Other     Social History Social History   Tobacco Use  . Smoking status: Current Every Day Smoker    Packs/day: 2.00    Types: Cigarettes  . Smokeless tobacco: Never Used  Substance Use Topics  . Alcohol use: Yes    Comment: a lot  . Drug use: Yes    Types: Cocaine, Marijuana     Allergies   Patient has no known allergies.   Review of Systems Review of Systems  Constitutional: Negative for chills and fever.  HENT: Negative for congestion, ear pain and sore throat.    Eyes: Negative for visual disturbance.  Respiratory: Positive for cough and shortness of breath.   Cardiovascular: Negative for palpitations and leg swelling.  Gastrointestinal: Negative for abdominal pain, constipation, diarrhea, nausea and vomiting.  Genitourinary: Negative for difficulty urinating.  Musculoskeletal: Positive for back pain. Negative for neck pain and neck stiffness.  Skin: Negative for rash.  Neurological: Positive for headaches. Negative for dizziness, syncope, speech difficulty, weakness, light-headedness and numbness.  Psychiatric/Behavioral: Negative for confusion.     Physical Exam Updated Vital Signs BP 137/80 (BP Location: Right Arm)   Pulse 65   Temp 97.9 F (36.6 C) (Oral)   Resp 16   Ht 5\' 4"  (1.626 m)   Wt 70.3 kg   SpO2 100%   BMI 26.61 kg/m   Physical Exam Vitals signs and nursing note reviewed.  Constitutional:      Appearance: He is not ill-appearing or diaphoretic.  HENT:     Head: Normocephalic and atraumatic.  Eyes:     Extraocular Movements: Extraocular movements intact.     Conjunctiva/sclera: Conjunctivae normal.     Pupils: Pupils are equal, round, and reactive to light.  Neck:     Musculoskeletal: Neck supple. No neck rigidity.  Cardiovascular:     Rate and Rhythm: Normal rate and regular rhythm.     Pulses: Normal pulses.  Pulmonary:     Effort: Pulmonary effort is normal.     Breath sounds: Normal breath sounds. No wheezing, rhonchi or rales.  Chest:     Chest wall: No tenderness.  Abdominal:     Palpations: Abdomen is soft.     Tenderness: There is no abdominal tenderness. There is no guarding or rebound.  Musculoskeletal:        General: No tenderness.     Right lower leg: No edema.     Left lower leg: No edema.  Lymphadenopathy:     Cervical: No cervical adenopathy.  Skin:    General: Skin is warm and dry.  Neurological:     Mental Status: He is alert.     Comments: CN 3-12 grossly intact A&O x4 GCS 15  Sensation and strength intact Coordination with finger-to-nose WNL Neg romberg, neg pronator drift      ED Treatments / Results  Labs (all labs ordered are listed, but only abnormal results are displayed) Labs Reviewed  NOVEL CORONAVIRUS, NAA (HOSP ORDER, SEND-OUT TO REF LAB; TAT 18-24 HRS)    EKG None  Radiology Dg Chest Port 1 View  Result Date: 10/07/2019 CLINICAL DATA:  Cough and shortness-of-breath 2 days. EXAM: PORTABLE CHEST 1 VIEW COMPARISON:  08/20/2018 FINDINGS: Lungs are clear. Cardiomediastinal silhouette, bones and soft tissues are normal. IMPRESSION: No active disease. Electronically Signed   By: Elberta Fortisaniel  Boyle M.D.   On: 10/07/2019 14:52  Procedures Procedures (including critical care time)  Medications Ordered in ED Medications  albuterol (VENTOLIN HFA) 108 (90 Base) MCG/ACT inhaler 2 puff (2 puffs Inhalation Given 10/07/19 1522)     Initial Impression / Assessment and Plan / ED Course  I have reviewed the triage vital signs and the nursing notes.  Pertinent labs & imaging results that were available during my care of the patient were reviewed by me and considered in my medical decision making (see chart for details).    38 year old current every day smoker who presents to the ED with complaints of shortness of breath, pain to mid back with exertion, cough, headache.  Recent exposure to COVID-19 positive individual.  All signs are stable in the ED.  Patient is afebrile without tachycardia or tachypnea.  He is satting 100% on room air.  He is able to speak in full sentences without accessory muscle use.  Very low concern for ACS or PE.  Patient is PERC negative.  SPECT he may be having symptoms related to COVID-19.  Given he is coughing will obtain a chest x-ray.  We will also swab with send out Covid test.  Will ambulate patient in the ED and ensure he is not desaturating prior to discharge.   Pt ambulated with oxygen sats remaining at 100%. CXR negative  today. Swabbed for covid. Given pt is current everyday smoker will provide albuterol inhaler to help with feelings of SOB. Pt advised that he will need to stay home and self isolate until he receives covid test results - if positive he will need to stay home and self isolate for 2 weeks. Advised to follow up with PCP - given info for [facility] as pt does not currently have a PCP. Strict return precautions have been discussed; pt is in agreement with plan at this time and stable for discharge home.   This note was prepared using Dragon voice recognition software and may include unintentional dictation errors due to the inherent limitations of voice recognition software.       Final Clinical Impressions(s) / ED Diagnoses   Final diagnoses:  Viral illness    ED Discharge Orders    None       Tanda Rockers, PA-C 10/07/19 1554    Long, Arlyss Repress, MD 10/07/19 1929

## 2019-10-08 LAB — NOVEL CORONAVIRUS, NAA (HOSP ORDER, SEND-OUT TO REF LAB; TAT 18-24 HRS): SARS-CoV-2, NAA: NOT DETECTED

## 2020-02-18 ENCOUNTER — Ambulatory Visit
Admission: EM | Admit: 2020-02-18 | Discharge: 2020-02-18 | Disposition: A | Discharge status: Home / Self Care | Payer: 59

## 2020-02-18 DIAGNOSIS — R519 Headache, unspecified: Secondary | ICD-10-CM

## 2020-02-18 MED ORDER — DEXAMETHASONE SODIUM PHOSPHATE 10 MG/ML IJ SOLN
10.0000 mg | Freq: Once | INTRAMUSCULAR | Status: AC
  Administered 2020-02-18: 10 mg via INTRAMUSCULAR

## 2020-02-18 MED ORDER — TIZANIDINE HCL 2 MG PO TABS: 2.0000 mg | ORAL_TABLET | Freq: Three times a day (TID) | ORAL | 0 refills | Status: DC | PRN

## 2020-02-18 MED ORDER — KETOROLAC TROMETHAMINE 15 MG/ML IJ SOLN
15.0000 mg | Freq: Once | INTRAMUSCULAR | Status: AC
  Administered 2020-02-18: 15 mg via INTRAMUSCULAR

## 2020-02-18 MED ORDER — METOCLOPRAMIDE HCL 5 MG/ML IJ SOLN
5.0000 mg | Freq: Once | INTRAMUSCULAR | Status: AC
  Administered 2020-02-18: 5 mg via INTRAMUSCULAR

## 2020-02-18 MED ORDER — MELOXICAM 7.5 MG PO TABS: 7.5000 mg | ORAL_TABLET | Freq: Every day | ORAL | 0 refills | Status: DC

## 2020-02-18 NOTE — ED Provider Notes (Signed)
[facility]    CSN: [id number] Arrival date & time: 02/18/20  1127      History   Chief Complaint Chief Complaint  Patient presents with  . Headache    HPI [name]. is a 39 y.o. male.   39 year old male with history of asthma comes in for 1.5-week history of headache.  States when this first started, had right-sided pressure when bending over, with intermittent sharp pains at times.  Since then now with intermittent shooting pain to the right side regardless of motion.  Has also felt right-sided neck pain/pressure prior to symptom onset that has since resolved.  Denies injury/trauma.  Denies loss of consciousness.  Denies weakness, dizziness, syncope.  Had few episodes of nausea without vomiting that has since resolved.  Denies phonophobia, photophobia.  Denies URI symptoms such as cough, congestion, sore throat.  Denies fever, chills, body aches.  Denies vision changes.  Did telemedicine 5 days ago, was given Imitrex without relief.  Current everyday smoker.  Denies tick bites.     Past Medical History:  Diagnosis Date  . Asthma     Patient Active Problem List   Diagnosis Date Noted  . Alcohol abuse   . 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 No pertinent surgical history.     Home Medications    Prior to Admission medications   Medication Sig Start Date End Date Taking? Authorizing Provider  SUMAtriptan (IMITREX) 50 MG tablet Take 50 mg by mouth every 2 (two) hours as needed for migraine. May repeat in 2 hours if headache persists or recurs.  02/18/20 Yes [provider]  meloxicam (MOBIC) 7.5 MG tablet Take 1 tablet (7.5 mg total) by mouth daily. 02/18/20   Tasia Catchings, Shaquon Gropp V, PA-C  tiZANidine (ZANAFLEX) 2 MG tablet Take 1 tablet (2 mg total) by mouth every 8 (eight) hours as needed for muscle spasms. 02/18/20   Ok Edwards, PA-C   Family History Family History  Problem Relation Age of Onset  . Diabetes Other   . Hypertension  Other     Social History Social History   Tobacco Use  . Smoking status: Current Every Day Smoker    Packs/day: 2.00    Types: Cigarettes  . Smokeless tobacco: Never Used  Substance Use Topics  . Alcohol use: Not Currently    Comment: a lot  . Drug use: Not Currently     Allergies   Patient has no known allergies.   Review of Systems Review of Systems  Reason unable to perform ROS: See HPI as above.     Physical Exam Triage Vital Signs ED Triage Vitals  Enc Vitals Group     BP 02/18/20 1136 (!) 152/79     Pulse Rate 02/18/20 1136 75     Resp 02/18/20 1136 16     Temp 02/18/20 1136 98.4 F (36.9 C)     Temp Source 02/18/20 1136 Oral     SpO2 02/18/20 1136 95 %     Weight --      Height --      Head Circumference --      Peak Flow --      Pain Score 02/18/20 1156 0     Pain Loc --      Pain Edu? --      Excl. in [place]? --    No data found.  Updated Vital Signs BP (!) 152/79 (BP Location: Left Arm)   Pulse 75  Temp 98.4 F (36.9 C) (Oral)   Resp 16   SpO2 95%   Physical Exam Constitutional:      General: He is not in acute distress.    Appearance: Normal appearance. He is well-developed. He is not toxic-appearing or diaphoretic.  HENT:     Head: Normocephalic and atraumatic.     Nose:     Right Sinus: No maxillary sinus tenderness or frontal sinus tenderness.     Left Sinus: No maxillary sinus tenderness or frontal sinus tenderness.     Mouth/Throat:     Mouth: Mucous membranes are moist.     Pharynx: Oropharynx is clear. Uvula midline.  Eyes:     Extraocular Movements: Extraocular movements intact.     Conjunctiva/sclera: Conjunctivae normal.     Pupils: Pupils are equal, round, and reactive to light.  Cardiovascular:     Rate and Rhythm: Normal rate and regular rhythm.  Pulmonary:     Effort: Pulmonary effort is normal. No respiratory distress.     Comments: Speaking in full sentences without difficulty. LCTAB Musculoskeletal:     Cervical  back: Normal range of motion and neck supple. No pain with movement, spinous process tenderness or muscular tenderness.  Skin:    General: Skin is warm and dry.  Neurological:     Mental Status: He is alert and oriented to person, place, and time.     Comments: Cranial nerves II-XII grossly intact. Strength 5/5 bilaterally for upper and lower extremity. Sensation intact. Normal coordination with normal finger to nose, heel to shin. Negative pronator drift, romberg. Gait intact. Able to ambulate on own without difficulty.      UC Treatments / Results  Labs (all labs ordered are listed, but only abnormal results are displayed) Labs Reviewed - No data to display  EKG   Radiology No results found.  Procedures Procedures (including critical care time)  Medications Ordered in UC Medications  metoCLOPramide (REGLAN) injection 5 mg (5 mg Intramuscular Given 02/18/20 1301)  dexamethasone (DECADRON) injection 10 mg (10 mg Intramuscular Given 02/18/20 1301)  ketorolac (TORADOL) 15 MG/ML injection 15 mg (15 mg Intramuscular Given 02/18/20 1302)    Initial Impression / Assessment and Plan / UC Course  I have reviewed the triage vital signs and the nursing notes.  Pertinent labs & imaging results that were available during my care of the patient were reviewed by me and considered in my medical decision making (see chart for details).    No alarming signs on exam.  Neurology exam grossly intact without focal deficits. ?  Migraine versus tension headache versus cluster headache.  Lower suspicion for RMSF at this time given no fever, no known tick bites.  At this time, will provide headache cocktail with Toradol, Reglan, Decadron injection in office today.  Will provide further symptomatic management for possible neck strain causing tension headache versus migraine.  Mobic and tizanidine as directed.  Push fluids.  Return precautions given.  Patient expresses understanding and agrees to  plan.  Final Clinical Impressions(s) / UC Diagnoses   Final diagnoses:  Intractable episodic headache, unspecified headache type   ED Prescriptions    Medication Sig Dispense Auth. Provider   meloxicam (MOBIC) 7.5 MG tablet Take 1 tablet (7.5 mg total) by mouth daily. 10 tablet Lonzell Dorris V, PA-C   tiZANidine (ZANAFLEX) 2 MG tablet Take 1 tablet (2 mg total) by mouth every 8 (eight) hours as needed for muscle spasms. 15 tablet Belinda Fisher, PA-C  PDMP not reviewed this encounter.   Belinda Fisher, PA-C 02/18/20 1358

## 2020-02-18 NOTE — Discharge Instructions (Signed)
No alarming signs on exam. Toradol, reglan, decadron injection in office today. Start Mobic. Do not take ibuprofen (motrin/advil)/ naproxen (aleve) while on mobic. Tizanidine as needed, this can make you drowsy, so do not take if you are going to drive, operate heavy machinery, or make important decisions. Keep hydrated, urine should be clear to pale yellow in color. Follow up with neurology for further evaluation if symptoms not improving. If develop fever, please contact us. If sudden worsening of symptoms, nausea/vomiting, dizziness, weakness, confusion, go to the emergency department for further evaluation.

## 2020-02-18 NOTE — ED Triage Notes (Signed)
Pt c/o reoccurring headaches x11days that has progressed to longer and more frequent episodes. States first started with position changing, now anytime. States had a telephone MD visit last Thursday and given migraine medication with no relief. States had one spell of dizziness when bending down shoveling mulch.

## 2020-02-25 DIAGNOSIS — R519 Headache, unspecified: Secondary | ICD-10-CM | POA: Insufficient documentation

## 2020-02-25 DIAGNOSIS — G935 Compression of brain: Secondary | ICD-10-CM | POA: Insufficient documentation

## 2020-02-25 DIAGNOSIS — R030 Elevated blood-pressure reading, without diagnosis of hypertension: Secondary | ICD-10-CM | POA: Insufficient documentation

## 2020-02-25 DIAGNOSIS — Z6826 Body mass index (BMI) 26.0-26.9, adult: Secondary | ICD-10-CM | POA: Insufficient documentation

## 2020-02-27 ENCOUNTER — Emergency Department (HOSPITAL_COMMUNITY): Payer: No Typology Code available for payment source

## 2020-02-27 ENCOUNTER — Other Ambulatory Visit: Payer: Self-pay | Admitting: Neurosurgery

## 2020-02-27 ENCOUNTER — Encounter (HOSPITAL_COMMUNITY): Payer: Self-pay | Admitting: Emergency Medicine

## 2020-02-27 ENCOUNTER — Other Ambulatory Visit: Payer: Self-pay

## 2020-02-27 ENCOUNTER — Emergency Department (HOSPITAL_COMMUNITY)
Admission: EM | Admit: 2020-02-27 | Discharge: 2020-02-27 | Disposition: A | Discharge status: Home / Self Care | Payer: No Typology Code available for payment source | Attending: Emergency Medicine | Admitting: Emergency Medicine

## 2020-02-27 DIAGNOSIS — Y92410 Unspecified street and highway as the place of occurrence of the external cause: Secondary | ICD-10-CM | POA: Diagnosis not present

## 2020-02-27 DIAGNOSIS — J45909 Unspecified asthma, uncomplicated: Secondary | ICD-10-CM | POA: Insufficient documentation

## 2020-02-27 DIAGNOSIS — S0990XA Unspecified injury of head, initial encounter: Secondary | ICD-10-CM | POA: Diagnosis not present

## 2020-02-27 DIAGNOSIS — Y998 Other external cause status: Secondary | ICD-10-CM | POA: Diagnosis not present

## 2020-02-27 DIAGNOSIS — Y9389 Activity, other specified: Secondary | ICD-10-CM | POA: Insufficient documentation

## 2020-02-27 DIAGNOSIS — S161XXA Strain of muscle, fascia and tendon at neck level, initial encounter: Secondary | ICD-10-CM | POA: Diagnosis not present

## 2020-02-27 DIAGNOSIS — F17210 Nicotine dependence, cigarettes, uncomplicated: Secondary | ICD-10-CM | POA: Insufficient documentation

## 2020-02-27 DIAGNOSIS — G935 Compression of brain: Secondary | ICD-10-CM

## 2020-02-27 DIAGNOSIS — S199XXA Unspecified injury of neck, initial encounter: Secondary | ICD-10-CM | POA: Diagnosis present

## 2020-02-27 MED ORDER — IBUPROFEN 800 MG PO TABS: 800.0000 mg | ORAL_TABLET | Freq: Three times a day (TID) | ORAL | 0 refills | Status: DC | PRN

## 2020-02-27 MED ORDER — ONDANSETRON 8 MG PO TBDP
8.0000 mg | ORAL_TABLET | Freq: Once | ORAL | Status: AC
  Administered 2020-02-27: 16:00:00 8 mg via ORAL
  Filled 2020-02-27: qty 1

## 2020-02-27 NOTE — Discharge Instructions (Signed)
Return here as needed.  Follow-up with your primary doctor.  Your CT scans did not show any abnormalities at this time.  If you have any changes or worsening in your condition he can return here for reevaluation.
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
## 2020-03-09 ENCOUNTER — Other Ambulatory Visit: Payer: Self-pay

## 2020-03-09 ENCOUNTER — Other Ambulatory Visit: Payer: Self-pay | Admitting: Nurse Practitioner

## 2020-03-09 ENCOUNTER — Ambulatory Visit
Admission: RE | Admit: 2020-03-09 | Discharge: 2020-03-09 | Disposition: A | Discharge status: Home / Self Care | Payer: No Typology Code available for payment source | Source: Ambulatory Visit | Attending: Nurse Practitioner | Admitting: Nurse Practitioner

## 2020-03-09 DIAGNOSIS — M542 Cervicalgia: Secondary | ICD-10-CM

## 2020-03-09 DIAGNOSIS — M546 Pain in thoracic spine: Secondary | ICD-10-CM

## 2020-03-09 DIAGNOSIS — M545 Low back pain, unspecified: Secondary | ICD-10-CM

## 2020-06-06 ENCOUNTER — Other Ambulatory Visit: Payer: Self-pay

## 2020-06-06 ENCOUNTER — Ambulatory Visit
Admission: EM | Admit: 2020-06-06 | Discharge: 2020-06-06 | Disposition: A | Discharge status: Home / Self Care | Payer: 59 | Attending: Emergency Medicine | Admitting: Emergency Medicine

## 2020-06-06 ENCOUNTER — Encounter: Payer: Self-pay | Admitting: Emergency Medicine

## 2020-06-06 DIAGNOSIS — T63441A Toxic effect of venom of bees, accidental (unintentional), initial encounter: Secondary | ICD-10-CM | POA: Diagnosis not present

## 2020-06-06 DIAGNOSIS — M7989 Other specified soft tissue disorders: Secondary | ICD-10-CM

## 2020-06-06 MED ORDER — PREDNISONE 10 MG (21) PO TBPK: ORAL_TABLET | Freq: Every day | ORAL | 0 refills | Status: DC

## 2020-06-06 NOTE — ED Triage Notes (Signed)
Pt presents to [facility] for assessment after receiving a bee sting yesterday to the left hand.  States swelling has just been getting worse, and that his whole forearm is now swollen, and it wasn't when he woke up.

## 2020-06-06 NOTE — Discharge Instructions (Signed)
Keep skin clean - may use gentle soaps without perfumes/dyes. Avoid hot water (showers, baths) as this can further dry out and irritate skin. Pat skin dry as rubbing can irritate and tear skin. Apply a gentle moisturizer 1-2 times daily.  Take steroid as discussed with breakfast: 6-5-4-3-2-1 Go to ER for swelling of throat, lips, tongue, chest pain, difficulty breathing.

## 2020-06-06 NOTE — ED Provider Notes (Signed)
[facility]    CSN: [id number] Arrival date & time: 06/06/20  0820      History   Chief Complaint Chief Complaint  Patient presents with  . Insect Bite    HPI [name]. is a 39 y.o. male with history of asthma presenting for left hand pain, swelling s/p bee sting yesterday.  States he was able to remove stinger, has been taking Benadryl without relief.  States his forearm is starting to swell.  No chest pain, difficulty breathing, swelling of tongue, lips, throat.  No lightheadedness or dizziness, nausea, diarrhea.   Past Medical History:  Diagnosis Date  . Asthma     Patient Active Problem List   Diagnosis Date Noted  . Alcohol abuse   . 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 No pertinent surgical history.     Home Medications    Prior to Admission medications   Medication Sig Start Date End Date Taking? Authorizing Provider  predniSONE (STERAPRED UNI-PAK 21 TAB) 10 MG (21) TBPK tablet Take by mouth daily. Take steroid taper as written 06/06/20   Hall-Potvin, [place], PA-C  SUMAtriptan (IMITREX) 50 MG tablet Take 50 mg by mouth every 2 (two) hours as needed for migraine. May repeat in 2 hours if headache persists or recurs.  02/18/20  [provider]    Family History Family History  Problem Relation Age of Onset  . Diabetes Other   . Hypertension Other     Social History Social History   Tobacco Use  . Smoking status: Current Every Day Smoker    Packs/day: 1.00    Types: Cigarettes  . Smokeless tobacco: Never Used  Substance Use Topics  . Alcohol use: Not Currently    Comment: a lot  . Drug use: Not Currently     Allergies   Patient has no known allergies.   Review of Systems As per HPI   Physical Exam Triage Vital Signs ED Triage Vitals  Enc Vitals Group     BP      Pulse      Resp      Temp      Temp src      SpO2      Weight      Height      Head Circumference      Peak Flow       Pain Score      Pain Loc      Pain Edu?      Excl. in GC?    No data found.  Updated Vital Signs BP (!) 143/90 (BP Location: Right Arm)   Pulse (!) 54   Temp 97.8 F (36.6 C) (Oral)   Resp 18   SpO2 98%   Visual Acuity Right Eye Distance:   Left Eye Distance:   Bilateral Distance:    Right Eye Near:   Left Eye Near:    Bilateral Near:     Physical Exam Constitutional:      General: He is not in acute distress. HENT:     Head: Normocephalic and atraumatic.  Eyes:     General: No scleral icterus.    Pupils: Pupils are equal, round, and reactive to light.  Cardiovascular:     Rate and Rhythm: Normal rate.  Pulmonary:     Effort: Pulmonary effort is normal. No respiratory distress.     Breath sounds: No wheezing.  Musculoskeletal:     Comments: Diffuse swelling of  left hand, distal forearm without overlying rash, erythema.  Mild TTP and decreased ROM/strength second to swelling.  NVI  Skin:    Coloration: Skin is not jaundiced or pale.  Neurological:     Mental Status: He is alert and oriented to person, place, and time.      UC Treatments / Results  Labs (all labs ordered are listed, but only abnormal results are displayed) Labs Reviewed - No data to display  EKG   Radiology No results found.  Procedures Procedures (including critical care time)  Medications Ordered in UC Medications - No data to display  Initial Impression / Assessment and Plan / UC Course  I have reviewed the triage vital signs and the nursing notes.  Pertinent labs & imaging results that were available during my care of the patient were reviewed by me and considered in my medical decision making (see chart for details).     Patient febrile, nontoxic, without airway compromise.  Patient does have swelling of left distal upper extremity.  We will start prednisone taper as outlined below, continue Benadryl.  Return precautions discussed, patient verbalized understanding and is  agreeable to plan. Final Clinical Impressions(s) / UC Diagnoses   Final diagnoses:  Bee sting, accidental or unintentional, initial encounter  Swelling of left hand     Discharge Instructions     Keep skin clean - may use gentle soaps without perfumes/dyes. Avoid hot water (showers, baths) as this can further dry out and irritate skin. Pat skin dry as rubbing can irritate and tear skin. Apply a gentle moisturizer 1-2 times daily.  Take steroid as discussed with breakfast: 6-5-4-3-2-1 Go to ER for swelling of throat, lips, tongue, chest pain, difficulty breathing.    ED Prescriptions    Medication Sig Dispense Auth. Provider   predniSONE (STERAPRED UNI-PAK 21 TAB) 10 MG (21) TBPK tablet Take by mouth daily. Take steroid taper as written 21 tablet Hall-Potvin, [place], PA-C     PDMP not reviewed this encounter.   Hall-Potvin, [place], [place] 06/06/20 [phone]
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
## 2021-05-13 NOTE — Progress Notes (Signed)
Virtual Visit via Telephone Note  I connected with [name]., on 05/14/2021 at 8:51 AM by telephone due to the COVID-19 pandemic and verified that I am speaking with the correct person using two identifiers.  Due to current restrictions/limitations of in-office visits due to the COVID-19 pandemic, this scheduled clinical appointment was converted to a telehealth visit.   Consent: I discussed the limitations, risks, security and privacy concerns of performing an evaluation and management service by telephone and the availability of in person appointments. I also discussed with the patient that there may be a patient responsible charge related to this service. The patient expressed understanding and agreed to proceed.  Location of Patient: Home  Location of Provider: [facility] at [facility]  Persons participating in Telemedicine visit: Ikaika Showers. Gabreil Stabs, NP Margorie John, CMA   History of Present Illness: [name] is a 40 year-old male who presents to establish care. PMH significant for [facility] spotted fever and alcohol abuse.   Current issues and/or concerns: Would like annual physical exam. Reports he quit smoking at least 3 months ago. Does have some lingering shortness of breath from history of smoking.  Wondering if maybe he has COPD. Reports both parents have COPD.   Past Medical History:  Diagnosis Date   Asthma    No Known Allergies  Current Outpatient Medications on File Prior to Visit  Medication Sig Dispense Refill   predniSONE (STERAPRED UNI-PAK 21 TAB) 10 MG (21) TBPK tablet Take by mouth daily. Take steroid taper as written 21 tablet 0   [DISCONTINUED] SUMAtriptan (IMITREX) 50 MG tablet Take 50 mg by mouth every 2 (two) hours as needed for migraine. May repeat in 2 hours if headache persists or recurs.     No current facility-administered medications on file prior to visit.    Observations/Objective: Alert and  oriented x 3. Not in acute distress. Physical examination not completed as this is a telemedicine visit.  Assessment and Plan: 1. Encounter to establish care: - Patient presents today to establish care.  - Return for annual physical examination, labs, and health maintenance. Arrive fasting meaning having no food for at least 8 hours prior to appointment. You may have only water or black coffee. Please take scheduled medications as normal.   Follow Up Instructions: Return for annual physical exam.   Patient was given clear instructions to go to Emergency Department or return to medical center if symptoms don't improve, worsen, or new problems develop.The patient verbalized understanding.  I discussed the assessment and treatment plan with the patient. The patient was provided an opportunity to ask questions and all were answered. The patient agreed with the plan and demonstrated an understanding of the instructions.   The patient was advised to call back or seek an in-person evaluation if the symptoms worsen or if the condition fails to improve as anticipated.    I provided 10 minutes total of non-face-to-face time during this encounter.   [name], NP  [facility] [place], [place] [phone] 05/14/2021, 8:51 AM

## 2021-05-14 ENCOUNTER — Encounter: Payer: Self-pay | Admitting: Family

## 2021-05-14 ENCOUNTER — Telehealth (INDEPENDENT_AMBULATORY_CARE_PROVIDER_SITE_OTHER): Payer: 59 | Admitting: Family

## 2021-05-14 ENCOUNTER — Other Ambulatory Visit: Payer: Self-pay

## 2021-05-14 DIAGNOSIS — Z7689 Persons encountering health services in other specified circumstances: Secondary | ICD-10-CM | POA: Diagnosis not present

## 2021-05-14 NOTE — Progress Notes (Signed)
Establish care

## 2021-05-26 ENCOUNTER — Encounter: Payer: 59 | Admitting: Family

## 2021-06-22 ENCOUNTER — Encounter: Payer: 59 | Admitting: Family

## 2021-06-22 NOTE — Progress Notes (Signed)
Patient did not show for appointment.   

## 2021-06-22 NOTE — Progress Notes (Deleted)
Patient ID: [name]., male    DOB: 30-Aug-1981  MRN: [id number]  CC: No chief complaint on file.  NEED TDAP  Subjective: [name] is a 40 y.o. male who presents for His concerns today include: ***  Patient Active Problem List   Diagnosis Date Noted   Body mass index (BMI) 26.0-26.9, adult 02/25/2020   Chiari malformation type I (HCC) 02/25/2020   Elevated blood-pressure reading, without diagnosis of hypertension 02/25/2020   Headache 02/25/2020   Alcohol abuse    [facility] spotted fever 08/27/2013     Current Outpatient Medications on File Prior to Visit  Medication Sig Dispense Refill   meloxicam (MOBIC) 7.5 MG tablet Take by mouth.     tiZANidine (ZANAFLEX) 2 MG tablet Take by mouth.     [DISCONTINUED] SUMAtriptan (IMITREX) 50 MG tablet Take 50 mg by mouth every 2 (two) hours as needed for migraine. May repeat in 2 hours if headache persists or recurs.     No current facility-administered medications on file prior to visit.    No Known Allergies  Social History   Socioeconomic History   Marital status: Single    Spouse name: Not on file   Number of children: Not on file   Years of education: Not on file   Highest education level: Not on file  Occupational History   Not on file  Tobacco Use   Smoking status: Every Day    Packs/day: 1.00    Types: Cigarettes   Smokeless tobacco: Never  Vaping Use   Vaping Use: Never used  Substance and Sexual Activity   Alcohol use: Not Currently    Comment: a lot   Drug use: Not Currently   Sexual activity: Not on file  Other Topics Concern   Not on file  Social History Narrative   Not on file   Social Determinants of Health   Financial Resource Strain: Not on file  Food Insecurity: Not on file  Transportation Needs: Not on file  Physical Activity: Not on file  Stress: Not on file  Social Connections: Not on file  Intimate Partner Violence: Not on file    Family History  Problem Relation Age of  Onset   COPD Mother    COPD Father    Diabetes Other    Hypertension Other     No past surgical history on file.  ROS: Review of Systems Negative except as stated above  PHYSICAL EXAM: There were no vitals taken for this visit.  Physical Exam  {male adult master:310786} {male adult master:310785}  CMP Latest Ref Rng & Units 08/20/2018 08/22/2013 08/19/2013  Glucose 70 - 99 mg/dL 314(H) 93 95  BUN 6 - 20 mg/dL 10 13 9   Creatinine 0.61 - 1.24 mg/dL 7.02 6.37  Sodium 135 - 145 mmol/L 139 137 132(L)  Potassium 3.5 - 5.1 mmol/L 3.6 3.8 3.9  Chloride 98 - 111 mmol/L 100 99 98  CO2 22 - 32 mmol/L 26 27 23   Calcium 8.9 - 10.3 mg/dL 9.5 8.58 9.4  Total Protein 6.5 - 8.1 g/dL 8.0 8.1 7.4  Total Bilirubin 0.3 - 1.2 mg/dL 0.8 0.4 0.6  Alkaline Phos 38 - 126 U/L 73 90 80  AST 15 - 41 U/L 49(H) 23 19  ALT 0 - 44 U/L 44 27 22   Lipid Panel     Component Value Date/Time   CHOL  04/20/2010 0330    160  ATP III CLASSIFICATION:  <200     mg/dL   Desirable  258-527  mg/dL   Borderline High  >=782    mg/dL   High          TRIG 423 (H) 04/20/2010 0330   HDL 31 (L) 04/20/2010 0330   CHOLHDL 5.2 04/20/2010 0330   VLDL 35 04/20/2010 0330   LDLCALC  04/20/2010 0330    94        Total Cholesterol/HDL:CHD Risk Coronary Heart Disease Risk Table                     Men   Women  1/2 Average Risk   3.4   3.3  Average Risk       5.0   4.4  2 X Average Risk   9.6   7.1  3 X Average Risk  23.4   11.0        Use the calculated Patient Ratio above and the CHD Risk Table to determine the patient's CHD Risk.        ATP III CLASSIFICATION (LDL):  <100     mg/dL   Optimal  536-144  mg/dL   Near or Above                    Optimal  130-159  mg/dL   Borderline  315-400  mg/dL   High  >867     mg/dL   Very High    CBC    Component Value Date/Time   WBC 9.0 08/20/2018 1226   RBC 5.45 08/20/2018 1226   HGB 17.3 (H) 08/20/2018 1226   HCT 48.6 08/20/2018 1226   PLT 451 (H)  08/20/2018 1226   MCV 89.2 08/20/2018 1226   MCH 31.7 08/20/2018 1226   MCHC 35.6 08/20/2018 1226   RDW 12.9 08/20/2018 1226   LYMPHSABS 2.5 08/22/2013 1605   MONOABS 1.2 (H) 08/22/2013 1605   EOSABS 0.5 08/22/2013 1605   BASOSABS 0.1 08/22/2013 1605    ASSESSMENT AND PLAN:  There are no diagnoses linked to this encounter.   Patient was given the opportunity to ask questions.  Patient verbalized understanding of the plan and was able to repeat key elements of the plan. Patient was given clear instructions to go to Emergency Department or return to medical center if symptoms don't improve, worsen, or new problems develop.The patient verbalized understanding.   No orders of the defined types were placed in this encounter.    Requested Prescriptions    No prescriptions requested or ordered in this encounter    No follow-ups on file.  [name], NP

## 2021-08-28 NOTE — Progress Notes (Deleted)
Patient ID: [name]., male    DOB: August 03, 1981  MRN: [id number]  CC: Annual Physical Exam  Subjective: [name] is a 40 y.o. male who presents for annual physical exam.   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     tiZANidine (ZANAFLEX) 2 MG tablet Take by mouth.     [DISCONTINUED] SUMAtriptan (IMITREX) 50 MG tablet Take 50 mg by mouth every 2 (two) hours as needed for migraine. May repeat in 2 hours if headache persists or recurs.     No current facility-administered medications on file prior to visit.    No Known Allergies  Social History   Socioeconomic History   Marital status: Single    Spouse name: Not on file   Number of children: Not on file   Years of education: Not on file   Highest education level: Not on file  Occupational History   Not on file  Tobacco Use   Smoking status: Every Day    Packs/day: 1.00    Types: Cigarettes   Smokeless tobacco: Never  Vaping Use   Vaping Use: Never used  Substance and Sexual Activity   Alcohol use: Not Currently    Comment: a lot   Drug use: Not Currently   Sexual activity: Not on file  Other Topics Concern   Not on file  Social History Narrative   Not on file   Social Determinants of Health   Financial Resource Strain: Not on file  Food Insecurity: Not on file  Transportation Needs: Not on file  Physical Activity: Not on file  Stress: Not on file  Social Connections: Not on file  Intimate Partner Violence: Not on file    Family History   Problem Relation Age of Onset   COPD Mother    COPD Father    Diabetes Other    Hypertension Other     No past surgical history on file.  ROS: Review of Systems Negative except as stated above  PHYSICAL EXAM: There were no vitals taken for this visit.  Physical Exam  {male adult master:310786} {male adult master:310785}  CMP Latest Ref Rng & Units 08/20/2018 08/22/2013 08/19/2013  Glucose 70 - 99 mg/dL 528(U) 93 95  BUN 6 - 20 mg/dL 10 13 9   Creatinine 0.61 - 1.24 mg/dL 1.32 4.40  Sodium 135 - 145 mmol/L 139 137 132(L)  Potassium 3.5 - 5.1 mmol/L 3.6 3.8 3.9  Chloride 98 - 111 mmol/L 100 99 98  CO2 22 - 32 mmol/L 26 27 23   Calcium 8.9 - 10.3 mg/dL 9.5 1.02 9.4  Total Protein 6.5 - 8.1 g/dL 8.0 8.1 7.4  Total Bilirubin 0.3 - 1.2 mg/dL 0.8 0.4 0.6  Alkaline Phos 38 - 126 U/L 73 90 80  AST 15 - 41 U/L 49(H) 23 19  ALT 0 - 44 U/L 44 27 22   Lipid Panel     Component Value Date/Time   CHOL  04/20/2010 0330    160  ATP III CLASSIFICATION:  <200     mg/dL   Desirable  235-361  mg/dL   Borderline High  >=443    mg/dL   High          TRIG 154 (H) 04/20/2010 0330   HDL 31 (L) 04/20/2010 0330   CHOLHDL 5.2 04/20/2010 0330   VLDL 35 04/20/2010 0330   LDLCALC  04/20/2010 0330    94        Total Cholesterol/HDL:CHD Risk Coronary Heart Disease Risk Table                     Men   Women  1/2 Average Risk   3.4   3.3  Average Risk       5.0   4.4  2 X Average Risk   9.6   7.1  3 X Average Risk  23.4   11.0        Use the calculated Patient Ratio above and the CHD Risk Table to determine the patient's CHD Risk.        ATP III CLASSIFICATION (LDL):  <100     mg/dL   Optimal  008-676  mg/dL   Near or Above                    Optimal  130-159  mg/dL   Borderline  195-093  mg/dL   High  >267     mg/dL   Very High    CBC    Component Value Date/Time   WBC 9.0 08/20/2018 1226   RBC 5.45 08/20/2018 1226   HGB 17.3 (H) 08/20/2018 1226   HCT 48.6  08/20/2018 1226   PLT 451 (H) 08/20/2018 1226   MCV 89.2 08/20/2018 1226   MCH 31.7 08/20/2018 1226   MCHC 35.6 08/20/2018 1226   RDW 12.9 08/20/2018 1226   LYMPHSABS 2.5 08/22/2013 1605   MONOABS 1.2 (H) 08/22/2013 1605   EOSABS 0.5 08/22/2013 1605   BASOSABS 0.1 08/22/2013 1605    ASSESSMENT AND PLAN:  There are no diagnoses linked to this encounter.   Patient was given the opportunity to ask questions.  Patient verbalized understanding of the plan and was able to repeat key elements of the plan. Patient was given clear instructions to go to Emergency Department or return to medical center if symptoms don't improve, worsen, or new problems develop.The patient verbalized understanding.   No orders of the defined types were placed in this encounter.    Requested Prescriptions    No prescriptions requested or ordered in this encounter    No follow-ups on file.  [name], NP

## 2021-09-01 ENCOUNTER — Encounter: Payer: 59 | Admitting: Family

## 2021-09-01 DIAGNOSIS — Z1159 Encounter for screening for other viral diseases: Secondary | ICD-10-CM

## 2021-09-01 DIAGNOSIS — Z13228 Encounter for screening for other metabolic disorders: Secondary | ICD-10-CM

## 2021-09-01 DIAGNOSIS — Z1329 Encounter for screening for other suspected endocrine disorder: Secondary | ICD-10-CM

## 2021-09-01 DIAGNOSIS — Z131 Encounter for screening for diabetes mellitus: Secondary | ICD-10-CM

## 2021-09-01 DIAGNOSIS — Z114 Encounter for screening for human immunodeficiency virus [HIV]: Secondary | ICD-10-CM

## 2021-09-01 DIAGNOSIS — Z13220 Encounter for screening for lipoid disorders: Secondary | ICD-10-CM

## 2021-09-01 DIAGNOSIS — Z130 Encounter for screening for diseases of the blood and blood-forming organs and certain disorders involving the immune mechanism: Secondary | ICD-10-CM

## 2021-09-01 DIAGNOSIS — Z0000 Encounter for general adult medical examination without abnormal findings: Secondary | ICD-10-CM

## 2023-02-22 ENCOUNTER — Encounter (HOSPITAL_BASED_OUTPATIENT_CLINIC_OR_DEPARTMENT_OTHER): Payer: Self-pay | Admitting: Urology

## 2023-02-22 ENCOUNTER — Emergency Department (HOSPITAL_BASED_OUTPATIENT_CLINIC_OR_DEPARTMENT_OTHER)
Admission: EM | Admit: 2023-02-22 | Discharge: 2023-02-22 | Disposition: A | Discharge status: Home / Self Care | Payer: Self-pay | Attending: Emergency Medicine | Admitting: Emergency Medicine

## 2023-02-22 ENCOUNTER — Emergency Department (HOSPITAL_BASED_OUTPATIENT_CLINIC_OR_DEPARTMENT_OTHER): Payer: Self-pay

## 2023-02-22 DIAGNOSIS — D72829 Elevated white blood cell count, unspecified: Secondary | ICD-10-CM | POA: Insufficient documentation

## 2023-02-22 DIAGNOSIS — R067 Sneezing: Secondary | ICD-10-CM | POA: Insufficient documentation

## 2023-02-22 DIAGNOSIS — R1031 Right lower quadrant pain: Secondary | ICD-10-CM | POA: Insufficient documentation

## 2023-02-22 DIAGNOSIS — R059 Cough, unspecified: Secondary | ICD-10-CM | POA: Insufficient documentation

## 2023-02-22 LAB — COMPREHENSIVE METABOLIC PANEL
ALT: 27 U/L (ref 0–44)
AST: 22 U/L (ref 15–41)
Albumin: 4.3 g/dL (ref 3.5–5.0)
Alkaline Phosphatase: 57 U/L (ref 38–126)
Anion gap: 6 (ref 5–15)
BUN: 16 mg/dL (ref 6–20)
CO2: 26 mmol/L (ref 22–32)
Calcium: 9.3 mg/dL (ref 8.9–10.3)
Chloride: 106 mmol/L (ref 98–111)
Creatinine, Ser: 0.85 mg/dL (ref 0.61–1.24)
GFR, Estimated: >60 mL/min (ref >60)
Glucose, Bld: 103 mg/dL — ABNORMAL HIGH (ref 70–99)
Potassium: 3.7 mmol/L (ref 3.5–5.1)
Sodium: 138 mmol/L (ref 135–145)
Total Bilirubin: 0.7 mg/dL (ref 0.3–1.2)
Total Protein: 7.4 g/dL (ref 6.5–8.1)

## 2023-02-22 LAB — CBC
HCT: 47 % (ref 39.0–52.0)
Hemoglobin: 15.6 g/dL (ref 13.0–17.0)
MCH: 28.8 pg (ref 26.0–34.0)
MCHC: 33.2 g/dL (ref 30.0–36.0)
MCV: 86.7 fL (ref 80.0–100.0)
Platelets: 406 10*3/uL — ABNORMAL HIGH (ref 150–400)
RBC: 5.42 MIL/uL (ref 4.22–5.81)
RDW: 12.6 % (ref 11.5–15.5)
WBC: 13.2 10*3/uL — ABNORMAL HIGH (ref 4.0–10.5)
nRBC: 0 % (ref 0.0–0.2)

## 2023-02-22 LAB — URINALYSIS, ROUTINE W REFLEX MICROSCOPIC
Bilirubin Urine: NEGATIVE
Glucose, UA: NEGATIVE mg/dL
Ketones, ur: NEGATIVE mg/dL
Leukocytes,Ua: NEGATIVE
Nitrite: NEGATIVE
Protein, ur: NEGATIVE mg/dL
Specific Gravity, Urine: 1.015 (ref 1.005–1.030)
pH: 7 (ref 5.0–8.0)

## 2023-02-22 LAB — URINALYSIS, MICROSCOPIC (REFLEX): WBC, UA: NONE SEEN WBC/hpf (ref 0–5)

## 2023-02-22 LAB — LIPASE, BLOOD: Lipase: 30 U/L (ref 11–51)

## 2023-02-22 MED ORDER — IOHEXOL 300 MG/ML  SOLN
100.0000 mL | Freq: Once | INTRAMUSCULAR | Status: AC | PRN
  Administered 2023-02-22: 100 mL via INTRAVENOUS

## 2023-02-22 MED ORDER — KETOROLAC TROMETHAMINE 30 MG/ML IJ SOLN
30.0000 mg | Freq: Once | INTRAMUSCULAR | Status: AC
  Administered 2023-02-22: 30 mg via INTRAVENOUS
  Filled 2023-02-22: qty 1

## 2023-02-22 MED ORDER — SODIUM CHLORIDE 0.9 % IV BOLUS
1000.0000 mL | Freq: Once | INTRAVENOUS | Status: AC
  Administered 2023-02-22: 1000 mL via INTRAVENOUS

## 2023-02-22 NOTE — ED Triage Notes (Signed)
Pt states RLQ pain that started this am, denies any fever  Denies any urinary symptoms  Pain worse with movement or cough  Denies n/v/d

## 2023-02-22 NOTE — Discharge Instructions (Signed)
You were seen in the emergency department for right lower quadrant abdominal pain.  You had blood work urinalysis and a CAT scan that did not show any evidence of appendicitis.  You can try ibuprofen 3 times a day along with a warm compress to the area.  Gentle stretching.  Follow-up with your regular doctor and return to the emergency department if any worsening or concerning symptoms.

## 2023-02-22 NOTE — ED Provider Notes (Signed)
[facility] Provider Note   CSN: [id number] Arrival date & time: 02/22/23  1949     History {Add pertinent medical, surgical, social history, OB history to HPI:1} Chief Complaint  Patient presents with   Abdominal Pain    Essex Haba. is a 42 y.o. male.  He has no significant past medical history.  He is complaining of some pain in his right lower quadrant that is been going on since he woke up this morning.  It hurts to cough or sneeze.  He has been moving his bowels normal and has a good appetite.  No fevers or chills.  Does feel little nauseous no vomiting.  In addition he has noticed some discomfort in his right testicle and it has been more high riding although this has been going on for months.  No urinary symptoms.  No prior surgical history.  The history is provided by the patient.  Abdominal Pain Pain location:  RLQ Pain quality: aching   Pain radiates to:  Does not radiate Pain severity:  Moderate Onset quality:  Gradual Duration:  1 day Timing:  Constant Progression:  Worsening Chronicity:  New Context: not trauma   Relieved by:  None tried Worsened by:  Movement, palpation and coughing Ineffective treatments:  None tried Associated symptoms: nausea   Associated symptoms: no chills, no constipation, no cough, no diarrhea, no dysuria, no fever, no hematuria and no vomiting        Home Medications Prior to Admission medications   Medication Sig Start Date End Date Taking? Authorizing Provider  meloxicam (MOBIC) 7.5 MG tablet Take by mouth.    [provider]  tiZANidine (ZANAFLEX) 2 MG tablet Take by mouth.    [provider]  SUMAtriptan (IMITREX) 50 MG tablet Take 50 mg by mouth every 2 (two) hours as needed for migraine. May repeat in 2 hours if headache persists or recurs.  02/18/20  [provider]      Allergies    Patient has no known allergies.    Review of Systems   Review of  Systems  Constitutional:  Negative for chills and fever.  Respiratory:  Negative for cough.   Gastrointestinal:  Positive for abdominal pain and nausea. Negative for constipation, diarrhea and vomiting.  Genitourinary:  Negative for dysuria and hematuria.    Physical Exam Updated Vital Signs BP (!) 148/91 (BP Location: Left Arm)   Pulse 72   Temp 99 F (37.2 C) (Oral)   Resp 18   Ht 5\' 4"  (1.626 m)   Wt 77.1 kg   SpO2 100%   BMI 29.18 kg/m  Physical Exam Vitals and nursing note reviewed.  Constitutional:      Appearance: He is well-developed.  HENT:     Head: Normocephalic and atraumatic.  Eyes:     Conjunctiva/sclera: Conjunctivae normal.  Cardiovascular:     Rate and Rhythm: Normal rate and regular rhythm.     Heart sounds: No murmur heard. Pulmonary:     Effort: Pulmonary effort is normal. No respiratory distress.     Breath sounds: Normal breath sounds.  Abdominal:     Palpations: Abdomen is soft.     Tenderness: There is abdominal tenderness in the right lower quadrant. There is guarding.  Genitourinary:    Testes:        Right: Tenderness present. Mass or swelling not present.        Left: Mass, tenderness or swelling not present.  Musculoskeletal:     Cervical back: Neck supple.  Skin:    General: Skin is warm and dry.  Neurological:     General: No focal deficit present.     Mental Status: He is alert.     GCS: GCS eye subscore is 4. GCS verbal subscore is 5. GCS motor subscore is 6.     ED Results / Procedures / Treatments   Labs (all labs ordered are listed, but only abnormal results are displayed) Labs Reviewed  COMPREHENSIVE METABOLIC PANEL - Abnormal; Notable for the following components:      Result Value   Glucose, Bld 103 (*)    All other components within normal limits  CBC - Abnormal; Notable for the following components:   WBC 13.2 (*)    Platelets 406 (*)    All other components within normal limits  LIPASE, BLOOD  URINALYSIS,  ROUTINE W REFLEX MICROSCOPIC    EKG None  Radiology No results found.  Procedures Procedures  {Document cardiac monitor, telemetry assessment procedure when appropriate:1}  Medications Ordered in ED Medications  sodium chloride 0.9 % bolus 1,000 mL (has no administration in time range)    ED Course/ Medical Decision Making/ A&P   {   Click here for ABCD2, HEART and other calculatorsREFRESH Note before signing :1}                          Medical Decision Making Amount and/or Complexity of Data Reviewed Labs: ordered. Radiology: ordered.   This patient complains of ***; this involves an extensive number of treatment Options and is a complaint that carries with it a high risk of complications and morbidity. The differential includes ***  I ordered, reviewed and interpreted labs, which included *** I ordered medication *** and reviewed PMP when indicated. I ordered imaging studies which included *** and I independently    visualized and interpreted imaging which showed *** Additional history obtained from *** Previous records obtained and reviewed *** I consulted *** and discussed lab and imaging findings and discussed disposition.  Cardiac monitoring reviewed, *** Social determinants considered, *** Critical Interventions: ***  After the interventions stated above, I reevaluated the patient and found *** Admission and further testing considered, ***   {Document critical care time when appropriate:1} {Document review of labs and clinical decision tools ie heart score, Chads2Vasc2 etc:1}  {Document your independent review of radiology images, and any outside records:1} {Document your discussion with family members, caretakers, and with consultants:1} {Document social determinants of health affecting pt's care:1} {Document your decision making why or why not admission, treatments were needed:1} Final Clinical Impression(s) / ED Diagnoses Final diagnoses:  None     Rx / DC Orders ED Discharge Orders     None

## 2023-02-24 ENCOUNTER — Ambulatory Visit: Payer: Self-pay | Admitting: *Deleted

## 2023-02-24 NOTE — Telephone Encounter (Signed)
Reason for Disposition  [1] MODERATE pain (e.g., interferes with normal activities) AND [2] pain comes and goes (cramps) AND [3] present > 24 hours  (Exception: Pain with Vomiting or Diarrhea - see that Guideline.)  Answer Assessment - Initial Assessment Questions 1. LOCATION: "Where does it hurt?"      LRQ 2. RADIATION: "Does the pain shoot anywhere else?" (e.g., chest, back)     Some to the back 3. ONSET: "When did the pain begin?" (Minutes, hours or days ago)      Tuesday am 4. SUDDEN: "Gradual or sudden onset?"     suddenly 5. PATTERN "Does the pain come and go, or is it constant?"    - If it comes and goes: "How long does it last?" "Do you have pain now?"     (Note: Comes and goes means the pain is intermittent. It goes away completely between bouts.)    - If constant: "Is it getting better, staying the same, or getting worse?"      (Note: Constant means the pain never goes away completely; most serious pain is constant and gets worse.)      Comes and goes- still is good- put movement hurts 6. SEVERITY: "How bad is the pain?"  (e.g., Scale 1-10; mild, moderate, or severe)    - MILD (1-3): Doesn't interfere with normal activities, abdomen soft and not tender to touch.     - MODERATE (4-7): Interferes with normal activities or awakens from sleep, abdomen tender to touch.     - SEVERE (8-10): Excruciating pain, doubled over, unable to do any normal activities.       severe 7. RECURRENT SYMPTOM: "Have you ever had this type of stomach pain before?" If Yes, ask: "When was the last time?" and "What happened that time?"      no 8. CAUSE: "What do you think is causing the stomach pain?"     Possible UTI 9. RELIEVING/AGGRAVATING FACTORS: "What makes it better or worse?" (e.g., antacids, bending or twisting motion, bowel movement)     Staying still 10. OTHER SYMPTOMS: "Do you have any other symptoms?" (e.g., back pain, diarrhea, fever, urination pain, vomiting)       nausea  Protocols  used: Abdominal Pain - Male-A-AH

## 2023-02-24 NOTE — Telephone Encounter (Signed)
  Chief Complaint: requesting review of ED labs- patient thinks he needs antibiotic Symptoms: abdominal pain- abnormal lab results Frequency: seen at ED 3/20  Disposition: [] ED /[] Urgent Care (no appt availability in office) / [] Appointment(In office/virtual)/ []  [facility]/ [] Home Care/ [] Refused Recommended Disposition /[] [facility]/ [x]  Follow-up with PCP Additional Notes: Patient states he was seen at ED/[facility]- he is concerned because he still has symptoms and he has seen abnormal lab results and has questions. Call to ED Lexine Baton) - she has taken patient information and she will have charge nurse call him back. Patient advised be seen if he gets worse- and follow advised given from ED.

## 2024-10-27 ENCOUNTER — Emergency Department (HOSPITAL_BASED_OUTPATIENT_CLINIC_OR_DEPARTMENT_OTHER): Payer: Self-pay

## 2024-10-27 ENCOUNTER — Encounter (HOSPITAL_BASED_OUTPATIENT_CLINIC_OR_DEPARTMENT_OTHER): Payer: Self-pay | Admitting: Emergency Medicine

## 2024-10-27 ENCOUNTER — Emergency Department (HOSPITAL_BASED_OUTPATIENT_CLINIC_OR_DEPARTMENT_OTHER)
Admission: EM | Admit: 2024-10-27 | Discharge: 2024-10-27 | Disposition: A | Discharge status: Home / Self Care | Payer: Self-pay | Attending: Emergency Medicine | Admitting: Emergency Medicine

## 2024-10-27 ENCOUNTER — Other Ambulatory Visit: Payer: Self-pay

## 2024-10-27 DIAGNOSIS — R0789 Other chest pain: Secondary | ICD-10-CM | POA: Insufficient documentation

## 2024-10-27 LAB — BASIC METABOLIC PANEL WITH GFR
Anion gap: 11 (ref 5–15)
BUN: 13 mg/dL (ref 6–20)
CO2: 24 mmol/L (ref 22–32)
Calcium: 9.4 mg/dL (ref 8.9–10.3)
Chloride: 103 mmol/L (ref 98–111)
Creatinine, Ser: 0.83 mg/dL (ref 0.61–1.24)
GFR, Estimated: >60 mL/min (ref >60)
Glucose, Bld: 114 mg/dL — ABNORMAL HIGH (ref 70–99)
Potassium: 4 mmol/L (ref 3.5–5.1)
Sodium: 138 mmol/L (ref 135–145)

## 2024-10-27 LAB — TROPONIN T, HIGH SENSITIVITY: Troponin T High Sensitivity: <15 ng/L (ref 0–19)

## 2024-10-27 LAB — CBC WITH DIFFERENTIAL/PLATELET
Abs Immature Granulocytes: 0.04 K/uL (ref 0.00–0.07)
Basophils Absolute: 0.1 K/uL (ref 0.0–0.1)
Basophils Relative: 1 %
Eosinophils Absolute: 0.4 K/uL (ref 0.0–0.5)
Eosinophils Relative: 4 %
HCT: 48.3 % (ref 39.0–52.0)
Hemoglobin: 16.4 g/dL (ref 13.0–17.0)
Immature Granulocytes: 0 %
Lymphocytes Relative: 32 %
Lymphs Abs: 3.3 K/uL (ref 0.7–4.0)
MCH: 29.9 pg (ref 26.0–34.0)
MCHC: 34 g/dL (ref 30.0–36.0)
MCV: 88.1 fL (ref 80.0–100.0)
Monocytes Absolute: 0.9 K/uL (ref 0.1–1.0)
Monocytes Relative: 9 %
Neutro Abs: 5.8 K/uL (ref 1.7–7.7)
Neutrophils Relative %: 54 %
Platelets: 441 K/uL — ABNORMAL HIGH (ref 150–400)
RBC: 5.48 MIL/uL (ref 4.22–5.81)
RDW: 12.8 % (ref 11.5–15.5)
WBC: 10.5 K/uL (ref 4.0–10.5)
nRBC: 0 % (ref 0.0–0.2)

## 2024-10-27 LAB — D-DIMER, QUANTITATIVE: D-Dimer, Quant: 0.34 ug{FEU}/mL (ref 0.00–0.50)

## 2024-10-27 MED ORDER — KETOROLAC TROMETHAMINE 15 MG/ML IJ SOLN
15.0000 mg | Freq: Once | INTRAMUSCULAR | Status: AC
  Administered 2024-10-27: 15 mg via INTRAVENOUS
  Filled 2024-10-27: qty 1

## 2024-10-27 MED ORDER — KETOROLAC TROMETHAMINE 10 MG PO TABS: 10.0000 mg | ORAL_TABLET | Freq: Four times a day (QID) | ORAL | 0 refills | Status: AC | PRN

## 2024-10-27 NOTE — ED Triage Notes (Signed)
 Pt endorses LT side CP under LT breast, pain worsens with deep inspiration x 1 week, increasing in freq. Denies recent illness

## 2024-10-27 NOTE — ED Provider Notes (Signed)
 [facility] Provider Note   CSN: [id number] Arrival date & time: 10/27/24  9152     Patient presents with: Chest Pain   [name]. is a 43 y.o. male.   This is a 43 year old male who is here today with pain on the left side of his chest under the left breast that he says worsens with deep inspiration.  Has been ongoing for 1 week.  Seems notes been getting worse.  Patient reports that he fell in between 2 boxes trucks a couple of weeks ago and struck the left side of his chest.   Chest Pain      Prior to Admission medications   Medication Sig Start Date End Date Taking? Authorizing Provider  ketorolac  (TORADOL ) 10 MG tablet Take 1 tablet (10 mg total) by mouth every 6 (six) hours as needed. 10/27/24  Yes Mannie Pac T, DO  meloxicam  (MOBIC ) 7.5 MG tablet Take by mouth.    [provider]  tiZANidine  (ZANAFLEX ) 2 MG tablet Take by mouth.    [provider]  SUMAtriptan (IMITREX) 50 MG tablet Take 50 mg by mouth every 2 (two) hours as needed for migraine. May repeat in 2 hours if headache persists or recurs.  02/18/20  [provider]    Allergies: Patient has no known allergies.    Review of Systems  Cardiovascular:  Positive for chest pain.    Updated Vital Signs BP (!) 160/87   Pulse 63   Temp 97.7 F (36.5 C) (Oral)   Resp 18   Wt 72.6 kg   SpO2 100%   BMI 27.46 kg/m   Physical Exam Vitals and nursing note reviewed.  Cardiovascular:     Rate and Rhythm: Normal rate.  Pulmonary:     Effort: Pulmonary effort is normal. No tachypnea or respiratory distress.  Chest:     Chest wall: Tenderness present.  Musculoskeletal:        General: Normal range of motion.     Cervical back: Normal range of motion.  Skin:    General: Skin is warm.  Neurological:     Mental Status: He is alert.     (all labs ordered are listed, but only abnormal results are displayed) Labs Reviewed  BASIC  METABOLIC PANEL WITH GFR - Abnormal; Notable for the following components:      Result Value   Glucose, Bld 114 (*)    All other components within normal limits  CBC WITH DIFFERENTIAL/PLATELET - Abnormal; Notable for the following components:   Platelets 441 (*)    All other components within normal limits  D-DIMER, QUANTITATIVE  TROPONIN T, HIGH SENSITIVITY  TROPONIN T, HIGH SENSITIVITY    EKG: EKG Interpretation Date/Time:  Sunday October 27 2024 09:05:09 EST Ventricular Rate:  52 PR Interval:  153 QRS Duration:  103 QT Interval:  412 QTC Calculation: 384 R Axis:   85  Text Interpretation: Sinus rhythm RSR' in V1 or V2, right VCD or RVH ST elev, probable normal early repol pattern Baseline wander in lead(s) V1 V2 Confirmed by Mannie Pac [phone]) on 10/27/2024 10:26:33 AM  Radiology: DG Chest 1 View Result Date: 10/27/2024 CLINICAL DATA:  cough EXAM: CHEST  1 VIEW COMPARISON:  October 07, 2019 FINDINGS: The cardiomediastinal silhouette is normal in contour. No pleural effusion. No pneumothorax. No acute pleuroparenchymal abnormality. IMPRESSION: No acute cardiopulmonary abnormality. Electronically Signed   By: Corean Salter M.D.   On: 10/27/2024 10:15  Procedures   Medications Ordered in the ED  ketorolac  (TORADOL ) 15 MG/ML injection 15 mg (has no administration in time range)                                    Medical Decision Making 43 year old male who is here today for left-sided chest pain.  Differential diagnoses include musculoskeletal chest pain, consider rib fracture, consider pneumothorax, consider ACS, less likely PE, less likely dissection, less likely intra-abdominal injury.  Plan -patient with reproducible pain over the left side of the lower chest wall.  There is no significant bruising, no tenderness in the abdomen.  Lung sounds are clear, heart sounds normal.  My independent review of the patient's EKG shows no ST segment depressions or  elevations, no T wave inversions, no evidence of acute ischemia.  Patient with a negative D-dimer.  Chest x-ray does not show any obvious injury, but certainly could be an occult fracture.  Will discharge patient with some Toradol , PCP follow-up.  This patient's health care is complicated by the following social determinants of health-lack of access to primary care.  I reviewed the patient's most recent urgent care note  Amount and/or Complexity of Data Reviewed Labs: ordered. Radiology: ordered.  Risk Prescription drug management.        Final diagnoses:  Chest wall pain    ED Discharge Orders          Ordered    ketorolac  (TORADOL ) 10 MG tablet  Every 6 hours PRN        10/27/24 1035               Mannie Pac T, DO 10/27/24 1038

## 2024-10-27 NOTE — ED Notes (Signed)

## 2024-10-27 NOTE — Discharge Instructions (Addendum)
 While you were in the emergency room, you had test done to look for signs of injury to your heart, lungs or ribs.  These test were all normal.  It is certainly possible that you may have a small crack in your ribs which could be contributing to your symptoms.  I would like you to take 10 mg of Toradol  every 6 hours for the next 5 days.  You may also take of 1000 mg of Tylenol  every 8 hours.  Symptoms should begin to improve over the next 1 to 2 weeks.  Return to the emergency department for any new or worsening symptoms.
